# Patient Record
Sex: Male | Born: 1980 | Race: White | Hispanic: No | Marital: Married | Smoking: Current every day smoker
Health system: Southern US, Community
[De-identification: ages and names within clinical notes are randomized; demographics above are authoritative.]

## PROBLEM LIST (undated history)

## (undated) DIAGNOSIS — F909 Attention-deficit hyperactivity disorder, unspecified type: Secondary | ICD-10-CM

## (undated) DIAGNOSIS — K219 Gastro-esophageal reflux disease without esophagitis: Secondary | ICD-10-CM

## (undated) DIAGNOSIS — E785 Hyperlipidemia, unspecified: Secondary | ICD-10-CM

## (undated) DIAGNOSIS — I10 Essential (primary) hypertension: Secondary | ICD-10-CM

## (undated) DIAGNOSIS — F419 Anxiety disorder, unspecified: Secondary | ICD-10-CM

## (undated) DIAGNOSIS — G4730 Sleep apnea, unspecified: Secondary | ICD-10-CM

## (undated) DIAGNOSIS — G43909 Migraine, unspecified, not intractable, without status migrainosus: Secondary | ICD-10-CM

## (undated) DIAGNOSIS — F4310 Post-traumatic stress disorder, unspecified: Secondary | ICD-10-CM

## (undated) DIAGNOSIS — J45909 Unspecified asthma, uncomplicated: Secondary | ICD-10-CM

## (undated) DIAGNOSIS — F329 Major depressive disorder, single episode, unspecified: Secondary | ICD-10-CM

## (undated) DIAGNOSIS — G709 Myoneural disorder, unspecified: Secondary | ICD-10-CM

## (undated) DIAGNOSIS — F32A Depression, unspecified: Secondary | ICD-10-CM

## (undated) DIAGNOSIS — F319 Bipolar disorder, unspecified: Secondary | ICD-10-CM

## (undated) HISTORY — PX: SCROTAL SURGERY: SHX2387

## (undated) HISTORY — DX: Bipolar disorder, unspecified: F31.9

## (undated) HISTORY — PX: CERVICAL FUSION: SHX112

## (undated) HISTORY — DX: Essential (primary) hypertension: I10

## (undated) HISTORY — PX: WISDOM TOOTH EXTRACTION: SHX21

## (undated) HISTORY — PX: ABDOMINAL SURGERY: SHX537

## (undated) HISTORY — DX: Post-traumatic stress disorder, unspecified: F43.10

## (undated) HISTORY — DX: Hyperlipidemia, unspecified: E78.5

## (undated) HISTORY — DX: Attention-deficit hyperactivity disorder, unspecified type: F90.9

## (undated) HISTORY — PX: CARPAL TUNNEL RELEASE: SHX101

---

## 2002-02-17 ENCOUNTER — Emergency Department (HOSPITAL_COMMUNITY): Admission: EM | Admit: 2002-02-17 | Discharge: 2002-02-17 | Payer: Self-pay | Admitting: Emergency Medicine

## 2007-03-02 ENCOUNTER — Emergency Department (HOSPITAL_COMMUNITY): Admission: EM | Admit: 2007-03-02 | Discharge: 2007-03-02 | Payer: Self-pay | Admitting: Emergency Medicine

## 2007-11-25 ENCOUNTER — Emergency Department (HOSPITAL_COMMUNITY): Admission: EM | Admit: 2007-11-25 | Discharge: 2007-11-25 | Payer: Self-pay | Admitting: Emergency Medicine

## 2008-01-20 ENCOUNTER — Emergency Department (HOSPITAL_COMMUNITY): Admission: EM | Admit: 2008-01-20 | Discharge: 2008-01-21 | Payer: Self-pay | Admitting: Emergency Medicine

## 2008-02-14 ENCOUNTER — Emergency Department (HOSPITAL_COMMUNITY): Admission: EM | Admit: 2008-02-14 | Discharge: 2008-02-14 | Payer: Self-pay | Admitting: Emergency Medicine

## 2008-03-02 ENCOUNTER — Emergency Department (HOSPITAL_COMMUNITY): Admission: EM | Admit: 2008-03-02 | Discharge: 2008-03-02 | Payer: Self-pay | Admitting: Emergency Medicine

## 2008-10-18 ENCOUNTER — Emergency Department (HOSPITAL_COMMUNITY): Admission: EM | Admit: 2008-10-18 | Discharge: 2008-10-18 | Payer: Self-pay | Admitting: Emergency Medicine

## 2008-12-29 ENCOUNTER — Emergency Department (HOSPITAL_COMMUNITY): Admission: EM | Admit: 2008-12-29 | Discharge: 2008-12-29 | Payer: Self-pay | Admitting: Emergency Medicine

## 2011-09-29 ENCOUNTER — Encounter (HOSPITAL_COMMUNITY): Payer: Self-pay | Admitting: *Deleted

## 2011-09-29 ENCOUNTER — Emergency Department (HOSPITAL_COMMUNITY)
Admission: EM | Admit: 2011-09-29 | Discharge: 2011-09-29 | Disposition: A | Discharge status: Home / Self Care | Payer: Medicaid Other | Attending: Emergency Medicine | Admitting: Emergency Medicine

## 2011-09-29 DIAGNOSIS — J45909 Unspecified asthma, uncomplicated: Secondary | ICD-10-CM | POA: Insufficient documentation

## 2011-09-29 DIAGNOSIS — G43909 Migraine, unspecified, not intractable, without status migrainosus: Secondary | ICD-10-CM | POA: Insufficient documentation

## 2011-09-29 DIAGNOSIS — I10 Essential (primary) hypertension: Secondary | ICD-10-CM | POA: Insufficient documentation

## 2011-09-29 DIAGNOSIS — F17200 Nicotine dependence, unspecified, uncomplicated: Secondary | ICD-10-CM | POA: Insufficient documentation

## 2011-09-29 HISTORY — DX: Migraine, unspecified, not intractable, without status migrainosus: G43.909

## 2011-09-29 HISTORY — DX: Unspecified asthma, uncomplicated: J45.909

## 2011-09-29 HISTORY — DX: Essential (primary) hypertension: I10

## 2011-09-29 MED ORDER — KETOROLAC TROMETHAMINE 60 MG/2ML IM SOLN
60.0000 mg | Freq: Once | INTRAMUSCULAR | Status: AC
  Administered 2011-09-29: 60 mg via INTRAMUSCULAR
  Filled 2011-09-29: qty 2

## 2011-09-29 MED ORDER — ONDANSETRON HCL 4 MG PO TABS: 4.0000 mg | ORAL_TABLET | Freq: Three times a day (TID) | ORAL | Status: DC | PRN

## 2011-09-29 MED ORDER — DIPHENHYDRAMINE HCL 50 MG/ML IJ SOLN
50.0000 mg | Freq: Once | INTRAMUSCULAR | Status: AC
  Administered 2011-09-29: 50 mg via INTRAMUSCULAR
  Filled 2011-09-29: qty 1

## 2011-09-29 MED ORDER — METOCLOPRAMIDE HCL 5 MG/ML IJ SOLN
10.0000 mg | Freq: Once | INTRAMUSCULAR | Status: AC
  Administered 2011-09-29: 10 mg via INTRAMUSCULAR
  Filled 2011-09-29: qty 2

## 2011-09-29 NOTE — ED Notes (Addendum)
Headache for 3 days, vomiting now. Pale and diaphorectic

## 2011-09-29 NOTE — ED Notes (Signed)
Nausea and vomiting subsided prior to medication.  Pt states he is feeling somewhat better since vomiting.

## 2011-09-29 NOTE — ED Provider Notes (Signed)
History     CSN: [id number]  Arrival date & time 09/29/11  1807   First MD Initiated Contact with Patient 09/29/11 1858      Chief Complaint  Patient presents with  . Headache    HPI Pt was seen at 1925.  Per pt, c/o gradual onset and persistence of constant acute flair of his chronic migraine headache for the past 3 days.  Describes the headache as per his usual chronic migraine headache pain pattern for many years.  Denies headache was sudden or maximal in onset or at any time.  Denies visual changes, no focal motor weakness, no tingling/numbness in extremities, no fevers, no neck pain, no rash.      Past Medical History  Diagnosis Date  . Migraine   . Hypertension   . Asthma     Past Surgical History  Procedure Date  . Scrotal surgery   . Carpal tunnel release     History  Substance Use Topics  . Smoking status: Current Everyday Smoker  . Smokeless tobacco: Not on file  . Alcohol Use: No    Review of Systems ROS: Statement: All systems negative except as marked or noted in the HPI; Constitutional: Negative for fever and chills. ; ; Eyes: Negative for eye pain, redness and discharge. ; ; ENMT: Negative for ear pain, hoarseness, nasal congestion, sinus pressure and sore throat. ; ; Cardiovascular: Negative for chest pain, palpitations, diaphoresis, dyspnea and peripheral edema. ; ; Respiratory: Negative for cough, wheezing and stridor. ; ; Gastrointestinal: +N/V.  Negative for diarrhea, abdominal pain, blood in stool, hematemesis, jaundice and rectal bleeding. . ; ; Genitourinary: Negative for dysuria, flank pain and hematuria. ; ; Musculoskeletal: Negative for back pain and neck pain. Negative for swelling and trauma.; ; Skin: Negative for pruritus, rash, abrasions, blisters, bruising and skin lesion.; ; Neuro: +headache.  Negative for lightheadedness and neck stiffness. Negative for weakness, altered level of consciousness , altered mental status, extremity weakness,  paresthesias, involuntary movement, seizure and syncope.     Allergies  Bee venom and Prednisone  Home Medications   Current Outpatient Rx  Name Route Sig Dispense Refill  . ALBUTEROL SULFATE HFA 108 (90 BASE) MCG/ACT IN AERS Inhalation Inhale 2 puffs into the lungs every 6 (six) hours as needed.    Marland Kitchen FLUOXETINE HCL 20 MG PO CAPS Oral Take 20 mg by mouth at bedtime.    Marland Kitchen PROPRANOLOL HCL 80 MG PO TABS Oral Take 80 mg by mouth every morning.    Marland Kitchen TRAMADOL HCL 50 MG PO TABS Oral Take 50 mg by mouth every 6 (six) hours as needed.      BP 162/93  Pulse 71  Temp 97.5 F (36.4 C) (Oral)  Resp 18  Ht 6' (1.829 m)  Wt 230 lb (104.327 kg)  BMI 31.19 kg/m2  SpO2 99%  Physical Exam 1730: Physical examination:  Nursing notes reviewed; Vital signs and O2 SAT reviewed;  Constitutional: Well developed, Well nourished, Well hydrated, In no acute distress; Head:  Normocephalic, atraumatic; Eyes: EOMI, PERRL, No scleral icterus; ENMT: Mouth and pharynx normal, Mucous membranes moist; Neck: Supple, Full range of motion, No lymphadenopathy; Cardiovascular: Regular rate and rhythm, No murmur, rub, or gallop; Respiratory: Breath sounds clear & equal bilaterally, No rales, rhonchi, wheezes.  Speaking full sentences with ease, Normal respiratory effort/excursion; Chest: Nontender, Movement normal; Abdomen: Soft, Nontender, Nondistended, Normal bowel sounds;; Extremities: Pulses normal, No tenderness, No edema, No calf edema or asymmetry.; Neuro: AA&Ox3, Major  CN grossly intact.  Speech clear. Climbs on and off stretcher by himself easily.  Gait steady. No gross focal motor or sensory deficits in extremities.; Skin: Color normal, Warm, Dry.   ED Course  Procedures   MDM  MDM Reviewed: vitals, nursing note and previous chart      8:47 PM:  Improved and wants to go home now.  Long hx of chronic migraine headaches, apparently usually goes to [facility] ED.  Pt endorses acute flair of his usual long  standing chronic pain today, no change from his usual chronic pain pattern.  Pt encouraged to f/u with his PMD and Pain Management doctor for good continuity of care and control of his chronic pain.  Verb understanding.         Laray Anger, DO 10/01/11 2228

## 2011-09-29 NOTE — ED Notes (Addendum)
Pt with active vomiting.  MD aware.

## 2011-10-03 ENCOUNTER — Encounter (HOSPITAL_COMMUNITY): Payer: Self-pay | Admitting: *Deleted

## 2011-10-03 ENCOUNTER — Emergency Department (HOSPITAL_COMMUNITY)
Admission: EM | Admit: 2011-10-03 | Discharge: 2011-10-03 | Disposition: A | Discharge status: Home / Self Care | Payer: Medicaid Other | Attending: Emergency Medicine | Admitting: Emergency Medicine

## 2011-10-03 DIAGNOSIS — T6391XA Toxic effect of contact with unspecified venomous animal, accidental (unintentional), initial encounter: Secondary | ICD-10-CM | POA: Insufficient documentation

## 2011-10-03 DIAGNOSIS — F17200 Nicotine dependence, unspecified, uncomplicated: Secondary | ICD-10-CM | POA: Insufficient documentation

## 2011-10-03 DIAGNOSIS — J45909 Unspecified asthma, uncomplicated: Secondary | ICD-10-CM | POA: Insufficient documentation

## 2011-10-03 DIAGNOSIS — I10 Essential (primary) hypertension: Secondary | ICD-10-CM | POA: Insufficient documentation

## 2011-10-03 DIAGNOSIS — T63481A Toxic effect of venom of other arthropod, accidental (unintentional), initial encounter: Secondary | ICD-10-CM | POA: Insufficient documentation

## 2011-10-03 MED ORDER — PREDNISONE 20 MG PO TABS
60.0000 mg | ORAL_TABLET | Freq: Once | ORAL | Status: DC
  Filled 2011-10-03: qty 3

## 2011-10-03 MED ORDER — FAMOTIDINE 20 MG PO TABS
20.0000 mg | ORAL_TABLET | Freq: Once | ORAL | Status: AC
  Administered 2011-10-03: 20 mg via ORAL
  Filled 2011-10-03: qty 1

## 2011-10-03 MED ORDER — DIPHENHYDRAMINE HCL 25 MG PO CAPS
50.0000 mg | ORAL_CAPSULE | Freq: Once | ORAL | Status: AC
  Administered 2011-10-03: 50 mg via ORAL
  Filled 2011-10-03: qty 2

## 2011-10-03 NOTE — ED Notes (Signed)
Bee sting to lt forearm ? Wasp-Swelling to forearm and hand,  Since then has had chest pain, with some sob, no rash.

## 2011-10-03 NOTE — ED Notes (Signed)
Patient comes to ED c/o of bee sting to the left arm at about 1100.  Patient reports a pain of 8/10.  Patient's arm is noticeably more swollen than the right arm.  Patient states having problem closing his hand entirely with it causing him the most amount of pain.

## 2011-10-03 NOTE — ED Provider Notes (Signed)
History   This chart was scribed for Gary Melter, MD by Shari Heritage. The patient was seen in room APA19/APA19. Patient's care was started at 1423.     CSN: [id number]  Arrival date & time 10/03/11  1423   First MD Initiated Contact with Patient 10/03/11 1508      Chief Complaint  Patient presents with  . Insect Bite    (Consider location/radiation/quality/duration/timing/severity/associated sxs/prior treatment) Patient is a 31 y.o. male presenting with allergic reaction. The history is provided by the patient. No language interpreter was used.  Allergic Reaction The primary symptoms are  shortness of breath and nausea. The primary symptoms do not include cough, vomiting or dizziness. The current episode started 3 to 5 hours ago. This is a new problem.  The shortness of breath began today. The shortness of breath developed suddenly. The shortness of breath is moderate. The patient's medical history is significant for asthma. The patient's medical history does not include CHF, COPD or chronic lung disease.  Nausea began today.  The onset of the reaction was associated with insect bite/sting. Significant symptoms also include itching. Significant symptoms that are not present include eye redness, flushing or rhinorrhea.    Gary Higgins is a 31 y.o. male who presents to the Emergency Department complaining of a bee sting to left forearm onset 4 hours ago with associated itching and swelling to the left forearm that began immediately after the sting. Other associated symptoms include SOB, chest tightness and nausea. Patient denies actual chest pain, cough, back pain, weakness, dizziness, vomiting. Patient states that he has a childhood history of allergic reactions to bee stings. Patient states that he did not take any medication after the sting. Patient's daily medications include Inderal for treatment of HTN and Prozac. Patient with medical h/o HTN, asthma and migraine. Surgical h/o  scrotal surgery and carpal tunnel release. Patient is a current everyday smoker.  Past Medical History  Diagnosis Date  . Migraine   . Hypertension   . Asthma     Past Surgical History  Procedure Date  . Scrotal surgery   . Carpal tunnel release     History reviewed. No pertinent family history.  History  Substance Use Topics  . Smoking status: Current Everyday Smoker  . Smokeless tobacco: Not on file  . Alcohol Use: No      Review of Systems  HENT: Negative for rhinorrhea.   Eyes: Negative for redness.  Respiratory: Positive for chest tightness and shortness of breath. Negative for cough.   Cardiovascular: Negative for chest pain.  Gastrointestinal: Positive for nausea. Negative for vomiting.  Musculoskeletal: Negative for back pain.  Skin: Positive for itching. Negative for flushing.  Neurological: Negative for dizziness and weakness.  All other systems reviewed and are negative.    Allergies  Bee venom and Prednisone  Home Medications   Current Outpatient Rx  Name Route Sig Dispense Refill  . ALBUTEROL SULFATE HFA 108 (90 BASE) MCG/ACT IN AERS Inhalation Inhale 2 puffs into the lungs every 6 (six) hours as needed.    Marland Kitchen FLUOXETINE HCL 20 MG PO CAPS Oral Take 20 mg by mouth at bedtime.    Marland Kitchen PROPRANOLOL HCL 80 MG PO TABS Oral Take 80 mg by mouth every morning.    Marland Kitchen TRAMADOL HCL 50 MG PO TABS Oral Take 50 mg by mouth every 6 (six) hours as needed.      BP 145/95  Pulse 89  Temp 97.7 F (36.5 C) (Oral)  Resp 21  Ht 6' (1.829 m)  Wt 235 lb (106.595 kg)  BMI 31.87 kg/m2  SpO2 99%  Physical Exam  Nursing note and vitals reviewed. Constitutional: He is oriented to person, place, and time. He appears well-developed and well-nourished. No distress.  HENT:  Head: Normocephalic and atraumatic.  Eyes: Conjunctivae and EOM are normal. Pupils are equal, round, and reactive to light.  Neck: Trachea normal. Neck supple. No tracheal deviation present.    Cardiovascular: Normal rate and regular rhythm.   No murmur heard. Pulmonary/Chest: Effort normal and breath sounds normal. No respiratory distress. He has no wheezes. He has no rhonchi.  Abdominal: Soft. He exhibits no distension and no mass. There is tenderness (mild).  Musculoskeletal: Normal range of motion.  Lymphadenopathy:       Right: No inguinal adenopathy present.       Left: No inguinal adenopathy present.  Neurological: He is alert and oriented to person, place, and time. No sensory deficit.  Skin: Skin is dry.  Psychiatric: He has a normal mood and affect. His behavior is normal.    ED Course  Procedures (including critical care time) DIAGNOSTIC STUDIES: Oxygen Saturation is 99% on room air, normal by my interpretation.    COORDINATION OF CARE: 3:13pm- Patient informed of current plan for treatment and evaluation and agrees with plan at this time. Will administer Benadryl, Pepcid and Prednisone to treat symptoms of allergic reaction.   5:07PM- Patient states that itching has subsided, but there is still swelling to his left forearm. Patient refused Prednisone treatment due to claims that he is allergic. Will discharge patient without home prescriptions.  Labs Reviewed - No data to display  No results found.   1. Insect sting allergy, current reaction       MDM  Left arm insect envenomation, likely, hymenoptera. Reaction is extrinsic. Patient refused prednisone in the emergency department because of a stated allergy. Patient is stable for discharge with symptomatic treatment.   Plan: Home Medications- Pepcid and Benadryl; Home Treatments- rest; Recommended follow up- return prn    I personally performed the services described in this documentation, which was scribed in my presence. The recorded information has been reviewed and considered.     Gary Melter, MD 10/03/11 [phone]

## 2012-03-17 DIAGNOSIS — G4730 Sleep apnea, unspecified: Secondary | ICD-10-CM

## 2012-03-17 HISTORY — DX: Sleep apnea, unspecified: G47.30

## 2012-09-13 ENCOUNTER — Encounter (HOSPITAL_COMMUNITY): Payer: Self-pay | Admitting: Emergency Medicine

## 2012-09-13 DIAGNOSIS — Y9301 Activity, walking, marching and hiking: Secondary | ICD-10-CM | POA: Insufficient documentation

## 2012-09-13 DIAGNOSIS — G43909 Migraine, unspecified, not intractable, without status migrainosus: Secondary | ICD-10-CM | POA: Insufficient documentation

## 2012-09-13 DIAGNOSIS — J45909 Unspecified asthma, uncomplicated: Secondary | ICD-10-CM | POA: Insufficient documentation

## 2012-09-13 DIAGNOSIS — Z79899 Other long term (current) drug therapy: Secondary | ICD-10-CM | POA: Insufficient documentation

## 2012-09-13 DIAGNOSIS — I10 Essential (primary) hypertension: Secondary | ICD-10-CM | POA: Insufficient documentation

## 2012-09-13 DIAGNOSIS — Y929 Unspecified place or not applicable: Secondary | ICD-10-CM | POA: Insufficient documentation

## 2012-09-13 DIAGNOSIS — F17200 Nicotine dependence, unspecified, uncomplicated: Secondary | ICD-10-CM | POA: Insufficient documentation

## 2012-09-13 DIAGNOSIS — S93409A Sprain of unspecified ligament of unspecified ankle, initial encounter: Secondary | ICD-10-CM | POA: Insufficient documentation

## 2012-09-13 DIAGNOSIS — X500XXA Overexertion from strenuous movement or load, initial encounter: Secondary | ICD-10-CM | POA: Insufficient documentation

## 2012-09-13 NOTE — ED Notes (Signed)
Patient states he injured his left foot last night while walking down stairs.

## 2012-09-14 ENCOUNTER — Emergency Department (HOSPITAL_COMMUNITY)
Admit: 2012-09-14 | Discharge: 2012-09-14 | Disposition: A | Discharge status: Home / Self Care | Payer: Medicaid Other | Attending: Emergency Medicine | Admitting: Emergency Medicine

## 2012-09-14 ENCOUNTER — Emergency Department (HOSPITAL_COMMUNITY): Payer: Medicaid Other

## 2012-09-14 ENCOUNTER — Emergency Department (HOSPITAL_COMMUNITY)
Admission: EM | Admit: 2012-09-14 | Discharge: 2012-09-14 | Disposition: A | Discharge status: Home / Self Care | Payer: Medicaid Other | Attending: Emergency Medicine | Admitting: Emergency Medicine

## 2012-09-14 DIAGNOSIS — S93402A Sprain of unspecified ligament of left ankle, initial encounter: Secondary | ICD-10-CM

## 2012-09-14 MED ORDER — HYDROCODONE-ACETAMINOPHEN 5-325 MG PO TABS
1.0000 | ORAL_TABLET | Freq: Once | ORAL | Status: AC
  Administered 2012-09-14: 1 via ORAL
  Filled 2012-09-14: qty 1

## 2012-09-14 MED ORDER — IBUPROFEN 600 MG PO TABS: 600.0000 mg | ORAL_TABLET | Freq: Four times a day (QID) | ORAL | Status: DC | PRN

## 2012-09-14 MED ORDER — HYDROCODONE-ACETAMINOPHEN 5-325 MG PO TABS: 1.0000 | ORAL_TABLET | ORAL | Status: DC | PRN

## 2012-09-14 NOTE — ED Provider Notes (Signed)
History    CSN: [id number] Arrival date & time 09/13/12  2337  First MD Initiated Contact with Patient 09/14/12 0107     Chief Complaint  Patient presents with  . Foot Injury   (Consider location/radiation/quality/duration/timing/severity/associated sxs/prior Treatment) HPI Comments: Gary Higgins is a 32 y.o. Male presenting with left ankle and foot pain since inverting the foot while walking down a flight of steps yesterday evening.  He describes constant aching pain without radiation which is worsened with weightbearing and with palpation.  He has applied ice packs and has been using elevation without relief of symptoms.  He has also tried tramadol which he has first chronic low back pain which is also not relieved his symptoms.  He denies numbness distal to the injury site.  He denies any other injuries.     The history is provided by the patient.   Past Medical History  Diagnosis Date  . Migraine   . Hypertension   . Asthma    Past Surgical History  Procedure Laterality Date  . Scrotal surgery    . Carpal tunnel release     No family history on file. History  Substance Use Topics  . Smoking status: Current Every Day Smoker  . Smokeless tobacco: Not on file  . Alcohol Use: No    Review of Systems  Musculoskeletal: Positive for joint swelling and arthralgias.  Skin: Negative for wound.  Neurological: Negative for weakness and numbness.    Allergies  Bee venom and Prednisone  Home Medications   Current Outpatient Rx  Name  Route  Sig  Dispense  Refill  . ALPRAZolam (XANAX) 0.5 MG tablet   Oral   Take 0.5 mg by mouth 2 (two) times daily.         . divalproex (DEPAKOTE) 500 MG DR tablet   Oral   Take 500 mg by mouth 2 (two) times daily.         . propranolol (INDERAL) 80 MG tablet   Oral   Take 80 mg by mouth every morning.         . traMADol (ULTRAM) 50 MG tablet   Oral   Take 50 mg by mouth every 6 (six) hours as needed.         Marland Kitchen  albuterol (PROVENTIL HFA;VENTOLIN HFA) 108 (90 BASE) MCG/ACT inhaler   Inhalation   Inhale 2 puffs into the lungs every 6 (six) hours as needed.         Marland Kitchen FLUoxetine (PROZAC) 20 MG capsule   Oral   Take 20 mg by mouth at bedtime.         Marland Kitchen HYDROcodone-acetaminophen (NORCO/VICODIN) 5-325 MG per tablet   Oral   Take 1 tablet by mouth every 4 (four) hours as needed for pain.   10 tablet   0   . ibuprofen (ADVIL,MOTRIN) 600 MG tablet   Oral   Take 1 tablet (600 mg total) by mouth every 6 (six) hours as needed for pain.   30 tablet   0   . omeprazole (PRILOSEC) 40 MG capsule   Oral   Take 40 mg by mouth daily.          BP 128/79  Pulse 94  Temp(Src) 97.6 F (36.4 C) (Oral)  Resp 18  Ht 6' (1.829 m)  Wt 235 lb (106.595 kg)  BMI 31.86 kg/m2  SpO2 94% Physical Exam  Nursing note and vitals reviewed. Constitutional: He appears well-developed and well-nourished.  HENT:  Head: Normocephalic.  Cardiovascular: Normal rate and intact distal pulses.  Exam reveals no decreased pulses.   Pulses:      Dorsalis pedis pulses are 2+ on the right side, and 2+ on the left side.       Posterior tibial pulses are 2+ on the right side, and 2+ on the left side.  Musculoskeletal: He exhibits edema and tenderness.       Left ankle: He exhibits decreased range of motion and swelling. He exhibits no ecchymosis, no deformity, no laceration and normal pulse. Tenderness. Lateral malleolus tenderness found. No head of 5th metatarsal and no proximal fibula tenderness found. Achilles tendon normal.  Tenderness to palpation also along proximal dorsal foot with mild soft tissue swelling.  Dorsalis pedis pulses is full.  Distal cap refill is less than 3 seconds.  Neurological: He is alert. No sensory deficit.  Skin: Skin is warm, dry and intact.    ED Course  Procedures (including critical care time) Labs Reviewed - No data to display Dg Ankle Complete Left  09/14/2012   *RADIOLOGY REPORT*   Clinical Data: Left foot and ankle pain.  LEFT ANKLE COMPLETE - 3+ VIEW  Comparison: None.  Findings: Ankle mortise congruent.  Talar dome intact.  There is no fracture.  Soft tissues appear within normal limits.  Achilles enthesopathy is noted.  IMPRESSION: No acute osseous abnormality.   Original Report Authenticated By: Andreas Newport, M.D.   Dg Foot Complete Left  09/14/2012   *RADIOLOGY REPORT*  Clinical Data: Pain and swelling after injury.  LEFT FOOT - COMPLETE 3+ VIEW  Comparison: None.  Findings: There is mild appearing soft tissue swelling over the dorsum of the foot.  No underlying bony or joint abnormality is identified.  IMPRESSION: Mild soft tissue swelling without fracture.   Original Report Authenticated By: Holley Dexter, M.D.   1. Ankle sprain, left, initial encounter     MDM  X-rays were reviewed prior to discharge home and discuss the patient.  He was placed in an ASO, crutches given.  Encouraged rest ice compression and elevation.  Prescribed ibuprofen and hydrocodone, advised to hold tramadol using hydrocodone.  Followup with PCP if not improving over the next 10 days.  Burgess Amor, PA-C 09/14/12 [phone]

## 2012-09-14 NOTE — ED Provider Notes (Signed)
Medical screening examination/treatment/procedure(s) were performed by non-physician practitioner and as supervising physician I was immediately available for consultation/collaboration.  Marquelle Musgrave S. Winifred Balogh, MD 09/14/12 0437 

## 2014-01-13 ENCOUNTER — Encounter (HOSPITAL_COMMUNITY): Payer: Self-pay | Admitting: Emergency Medicine

## 2014-01-13 ENCOUNTER — Emergency Department (HOSPITAL_COMMUNITY)
Admission: EM | Admit: 2014-01-13 | Discharge: 2014-01-13 | Disposition: A | Discharge status: Home / Self Care | Payer: Medicaid Other | Attending: Emergency Medicine | Admitting: Emergency Medicine

## 2014-01-13 ENCOUNTER — Emergency Department (HOSPITAL_COMMUNITY): Payer: Medicaid Other

## 2014-01-13 DIAGNOSIS — Y92410 Unspecified street and highway as the place of occurrence of the external cause: Secondary | ICD-10-CM | POA: Diagnosis not present

## 2014-01-13 DIAGNOSIS — I10 Essential (primary) hypertension: Secondary | ICD-10-CM | POA: Insufficient documentation

## 2014-01-13 DIAGNOSIS — S40812A Abrasion of left upper arm, initial encounter: Secondary | ICD-10-CM | POA: Insufficient documentation

## 2014-01-13 DIAGNOSIS — M25512 Pain in left shoulder: Secondary | ICD-10-CM

## 2014-01-13 DIAGNOSIS — S4992XA Unspecified injury of left shoulder and upper arm, initial encounter: Secondary | ICD-10-CM | POA: Diagnosis present

## 2014-01-13 DIAGNOSIS — Z720 Tobacco use: Secondary | ICD-10-CM | POA: Diagnosis not present

## 2014-01-13 DIAGNOSIS — Z79899 Other long term (current) drug therapy: Secondary | ICD-10-CM | POA: Diagnosis not present

## 2014-01-13 DIAGNOSIS — Y9389 Activity, other specified: Secondary | ICD-10-CM | POA: Diagnosis not present

## 2014-01-13 DIAGNOSIS — M542 Cervicalgia: Secondary | ICD-10-CM

## 2014-01-13 DIAGNOSIS — S3992XA Unspecified injury of lower back, initial encounter: Secondary | ICD-10-CM | POA: Insufficient documentation

## 2014-01-13 DIAGNOSIS — S0990XA Unspecified injury of head, initial encounter: Secondary | ICD-10-CM | POA: Diagnosis not present

## 2014-01-13 DIAGNOSIS — G43909 Migraine, unspecified, not intractable, without status migrainosus: Secondary | ICD-10-CM | POA: Diagnosis not present

## 2014-01-13 DIAGNOSIS — S199XXA Unspecified injury of neck, initial encounter: Secondary | ICD-10-CM | POA: Diagnosis not present

## 2014-01-13 DIAGNOSIS — J45909 Unspecified asthma, uncomplicated: Secondary | ICD-10-CM | POA: Insufficient documentation

## 2014-01-13 MED ORDER — IBUPROFEN 800 MG PO TABS: 800.0000 mg | ORAL_TABLET | Freq: Three times a day (TID) | ORAL | Status: DC

## 2014-01-13 MED ORDER — TETANUS-DIPHTH-ACELL PERTUSSIS 5-2.5-18.5 LF-MCG/0.5 IM SUSP
0.5000 mL | Freq: Once | INTRAMUSCULAR | Status: AC
  Administered 2014-01-13: 0.5 mL via INTRAMUSCULAR
  Filled 2014-01-13: qty 0.5

## 2014-01-13 MED ORDER — BACITRACIN ZINC 500 UNIT/GM EX OINT
TOPICAL_OINTMENT | CUTANEOUS | Status: AC
  Administered 2014-01-13: 1
  Filled 2014-01-13: qty 2.7

## 2014-01-13 MED ORDER — CYCLOBENZAPRINE HCL 10 MG PO TABS: 10.0000 mg | ORAL_TABLET | Freq: Two times a day (BID) | ORAL | Status: DC | PRN
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
## 2014-01-13 NOTE — ED Notes (Signed)
Pt removed from back board by EDP

## 2014-05-04 ENCOUNTER — Other Ambulatory Visit (HOSPITAL_COMMUNITY): Payer: Self-pay | Admitting: Neurosurgery

## 2014-05-10 ENCOUNTER — Other Ambulatory Visit (HOSPITAL_COMMUNITY): Payer: Self-pay | Admitting: *Deleted

## 2014-05-10 NOTE — Progress Notes (Signed)
Pre-op orders not signed. Called Dr. Sueanne Margaritaabbell's office and left message on Susan's (his scheduler) voicemail requesting orders be signed.

## 2014-05-11 ENCOUNTER — Encounter (HOSPITAL_COMMUNITY)
Admission: RE | Admit: 2014-05-11 | Discharge: 2014-05-11 | Disposition: A | Discharge status: Home / Self Care | Payer: Medicaid Other | Source: Ambulatory Visit | Attending: Neurosurgery | Admitting: Neurosurgery

## 2014-05-11 ENCOUNTER — Encounter (HOSPITAL_COMMUNITY): Payer: Self-pay

## 2014-05-11 DIAGNOSIS — I10 Essential (primary) hypertension: Secondary | ICD-10-CM | POA: Diagnosis not present

## 2014-05-11 DIAGNOSIS — F419 Anxiety disorder, unspecified: Secondary | ICD-10-CM | POA: Diagnosis not present

## 2014-05-11 DIAGNOSIS — Z91030 Bee allergy status: Secondary | ICD-10-CM | POA: Diagnosis not present

## 2014-05-11 DIAGNOSIS — Z91040 Latex allergy status: Secondary | ICD-10-CM | POA: Diagnosis not present

## 2014-05-11 DIAGNOSIS — M5022 Other cervical disc displacement, mid-cervical region: Secondary | ICD-10-CM | POA: Diagnosis not present

## 2014-05-11 DIAGNOSIS — F329 Major depressive disorder, single episode, unspecified: Secondary | ICD-10-CM | POA: Diagnosis not present

## 2014-05-11 DIAGNOSIS — G4730 Sleep apnea, unspecified: Secondary | ICD-10-CM | POA: Diagnosis not present

## 2014-05-11 DIAGNOSIS — G709 Myoneural disorder, unspecified: Secondary | ICD-10-CM | POA: Diagnosis not present

## 2014-05-11 DIAGNOSIS — J45909 Unspecified asthma, uncomplicated: Secondary | ICD-10-CM | POA: Diagnosis not present

## 2014-05-11 DIAGNOSIS — K219 Gastro-esophageal reflux disease without esophagitis: Secondary | ICD-10-CM | POA: Diagnosis not present

## 2014-05-11 DIAGNOSIS — F17210 Nicotine dependence, cigarettes, uncomplicated: Secondary | ICD-10-CM | POA: Diagnosis not present

## 2014-05-11 DIAGNOSIS — Z888 Allergy status to other drugs, medicaments and biological substances status: Secondary | ICD-10-CM | POA: Diagnosis not present

## 2014-05-11 DIAGNOSIS — Z79899 Other long term (current) drug therapy: Secondary | ICD-10-CM | POA: Diagnosis not present

## 2014-05-11 HISTORY — DX: Myoneural disorder, unspecified: G70.9

## 2014-05-11 HISTORY — DX: Major depressive disorder, single episode, unspecified: F32.9

## 2014-05-11 HISTORY — DX: Anxiety disorder, unspecified: F41.9

## 2014-05-11 HISTORY — DX: Sleep apnea, unspecified: G47.30

## 2014-05-11 HISTORY — DX: Depression, unspecified: F32.A

## 2014-05-11 HISTORY — DX: Gastro-esophageal reflux disease without esophagitis: K21.9

## 2014-05-11 LAB — CBC
HCT: 43.2 % (ref 39.0–52.0)
Hemoglobin: 15 g/dL (ref 13.0–17.0)
MCH: 29.1 pg (ref 26.0–34.0)
MCHC: 34.7 g/dL (ref 30.0–36.0)
MCV: 83.9 fL (ref 78.0–100.0)
Platelets: 219 10*3/uL (ref 150–400)
RBC: 5.15 MIL/uL (ref 4.22–5.81)
RDW: 12.3 % (ref 11.5–15.5)
WBC: 10.6 10*3/uL — ABNORMAL HIGH (ref 4.0–10.5)

## 2014-05-11 LAB — BASIC METABOLIC PANEL
Anion gap: 11 (ref 5–15)
BUN: 6 mg/dL (ref 6–23)
CO2: 25 mmol/L (ref 19–32)
Calcium: 9.7 mg/dL (ref 8.4–10.5)
Chloride: 104 mmol/L (ref 96–112)
Creatinine, Ser: 0.93 mg/dL (ref 0.50–1.35)
GFR calc Af Amer: >90 mL/min (ref >90)
GFR calc non Af Amer: >90 mL/min (ref >90)
Glucose, Bld: 106 mg/dL — ABNORMAL HIGH (ref 70–99)
Potassium: 4 mmol/L (ref 3.5–5.1)
Sodium: 140 mmol/L (ref 135–145)

## 2014-05-11 LAB — SURGICAL PCR SCREEN
MRSA, PCR: NEGATIVE
Staphylococcus aureus: POSITIVE — AB

## 2014-05-11 NOTE — Pre-Procedure Instructions (Signed)
Gary LovelyRobert D Mote  05/11/2014   Your procedure is scheduled on:  05/17/2014  [facility] at 6:30 AM.  Call this number if you have problems the morning of surgery: [phone]   Remember:   Do not eat food or drink liquids after midnight.  On Tuesday    Take these medicines the morning of surgery with A SIP OF WATER: Xanax, Robaxin, Omeprazole, Effexor, Inderal    Do not wear jewelry   Do not wear lotions, powders, or perfumes. You may wear deodorant.              Men may shave face and neck.   Do not bring valuables to the hospital.  [facility] is not responsible                  for any belongings or valuables.               Contacts, dentures or bridgework may not be worn into surgery.   Leave suitcase in the car. After surgery it may be brought to your room.   For patients admitted to the hospital, discharge time is determined by your                treatment team.               Patients discharged the day of surgery will not be allowed to drive  home.  Name and phone number of your driver: with wife  Special Instructions: Special Instructions: [place] - Preparing for Surgery  Before surgery, you can play an important role.  Because skin is not sterile, your skin needs to be as free of germs as possible.  You can reduce the number of germs on you skin by washing with CHG (chlorahexidine gluconate) soap before surgery.  CHG is an antiseptic cleaner which kills germs and bonds with the skin to continue killing germs even after washing.  Please DO NOT use if you have an allergy to CHG or antibacterial soaps.  If your skin becomes reddened/irritated stop using the CHG and inform your nurse when you arrive at Short Stay.  Do not shave (including legs and underarms) for at least 48 hours prior to the first CHG shower.  You may shave your face.  Please follow these instructions carefully:   1.  Shower with CHG Soap the night before surgery and  the  morning of Surgery.  2.  If you choose to wash your hair, wash your hair first as usual with your  normal shampoo.  3.  After you shampoo, rinse your hair and body thoroughly to remove the  Shampoo.  4.  Use CHG as you would any other liquid soap.  You can apply chg directly to the skin and wash gently with scrungie or a clean washcloth.  5.  Apply the CHG Soap to your body ONLY FROM THE NECK DOWN.    Do not use on open wounds or open sores.  Avoid contact with your eyes, ears, mouth and genitals (private parts).  Wash genitals (private parts)   with your normal soap.  6.  Wash thoroughly, paying special attention to the area where your surgery will be performed.  7.  Thoroughly rinse your body with warm water from the neck down.  8.  DO NOT shower/wash with your normal soap after using and rinsing off   the CHG Soap.  9.  Pat yourself dry with a  clean towel.            10.  Wear clean pajamas.            11.  Place clean sheets on your bed the night of your first shower and do not sleep with pets.  Day of Surgery  Do not apply any lotions/deodorants the morning of surgery.  Please wear clean clothes to the hospital/surgery center.   Please read over the following fact sheets that you were given: Pain Booklet, Coughing and Deep Breathing, MRSA Information and Surgical Site Infection Prevention

## 2014-05-11 NOTE — Progress Notes (Addendum)
I called a prescription for Mupirocin ointment to [facility].

## 2014-05-11 NOTE — Progress Notes (Signed)
Call to pharm. Tech to do medication reconciliation

## 2014-05-11 NOTE — Progress Notes (Signed)
Call to Dr. Franky Machoabbell office, asked that Dr. Franky Machoabbell sign his epic orders.  Spoke with Lupita Leashonna.

## 2014-05-11 NOTE — Progress Notes (Signed)
RE[facility]ing [facility] in the distant past, also reports that he had a sleep study there as well.

## 2014-05-16 MED ORDER — CEFAZOLIN SODIUM-DEXTROSE 2-3 GM-% IV SOLR
2.0000 g | INTRAVENOUS | Status: AC
  Administered 2014-05-17: 2 g via INTRAVENOUS
  Filled 2014-05-16: qty 50

## 2014-05-17 ENCOUNTER — Ambulatory Visit (HOSPITAL_COMMUNITY): Payer: Medicaid Other | Admitting: Emergency Medicine

## 2014-05-17 ENCOUNTER — Ambulatory Visit (HOSPITAL_COMMUNITY): Payer: Medicaid Other | Admitting: Certified Registered Nurse Anesthetist

## 2014-05-17 ENCOUNTER — Encounter (HOSPITAL_COMMUNITY)
Admission: RE | Disposition: A | Discharge status: Home / Self Care | Payer: Self-pay | Source: Ambulatory Visit | Attending: Neurosurgery

## 2014-05-17 ENCOUNTER — Ambulatory Visit (HOSPITAL_COMMUNITY)
Admission: RE | Admit: 2014-05-17 | Discharge: 2014-05-18 | Disposition: A | Discharge status: Home / Self Care | Payer: Medicaid Other | Source: Ambulatory Visit | Attending: Neurosurgery | Admitting: Neurosurgery

## 2014-05-17 ENCOUNTER — Encounter (HOSPITAL_COMMUNITY): Payer: Self-pay | Admitting: Certified Registered Nurse Anesthetist

## 2014-05-17 ENCOUNTER — Ambulatory Visit (HOSPITAL_COMMUNITY): Payer: Medicaid Other

## 2014-05-17 DIAGNOSIS — F17210 Nicotine dependence, cigarettes, uncomplicated: Secondary | ICD-10-CM | POA: Insufficient documentation

## 2014-05-17 DIAGNOSIS — G709 Myoneural disorder, unspecified: Secondary | ICD-10-CM | POA: Insufficient documentation

## 2014-05-17 DIAGNOSIS — I10 Essential (primary) hypertension: Secondary | ICD-10-CM | POA: Diagnosis not present

## 2014-05-17 DIAGNOSIS — F329 Major depressive disorder, single episode, unspecified: Secondary | ICD-10-CM | POA: Insufficient documentation

## 2014-05-17 DIAGNOSIS — Z91040 Latex allergy status: Secondary | ICD-10-CM | POA: Insufficient documentation

## 2014-05-17 DIAGNOSIS — F419 Anxiety disorder, unspecified: Secondary | ICD-10-CM | POA: Insufficient documentation

## 2014-05-17 DIAGNOSIS — G4730 Sleep apnea, unspecified: Secondary | ICD-10-CM | POA: Diagnosis not present

## 2014-05-17 DIAGNOSIS — Z419 Encounter for procedure for purposes other than remedying health state, unspecified: Secondary | ICD-10-CM

## 2014-05-17 DIAGNOSIS — Z888 Allergy status to other drugs, medicaments and biological substances status: Secondary | ICD-10-CM | POA: Insufficient documentation

## 2014-05-17 DIAGNOSIS — Z91030 Bee allergy status: Secondary | ICD-10-CM | POA: Insufficient documentation

## 2014-05-17 DIAGNOSIS — M5022 Other cervical disc displacement, mid-cervical region: Secondary | ICD-10-CM | POA: Insufficient documentation

## 2014-05-17 DIAGNOSIS — J45909 Unspecified asthma, uncomplicated: Secondary | ICD-10-CM | POA: Insufficient documentation

## 2014-05-17 DIAGNOSIS — K219 Gastro-esophageal reflux disease without esophagitis: Secondary | ICD-10-CM | POA: Insufficient documentation

## 2014-05-17 DIAGNOSIS — Z79899 Other long term (current) drug therapy: Secondary | ICD-10-CM | POA: Insufficient documentation

## 2014-05-17 DIAGNOSIS — M5020 Other cervical disc displacement, unspecified cervical region: Secondary | ICD-10-CM | POA: Diagnosis present

## 2014-05-17 HISTORY — PX: ANTERIOR CERVICAL DECOMP/DISCECTOMY FUSION: SHX1161

## 2014-05-17 SURGERY — ANTERIOR CERVICAL DECOMPRESSION/DISCECTOMY FUSION 1 LEVEL
Anesthesia: General | Site: Neck

## 2014-05-17 MED ORDER — MORPHINE SULFATE 2 MG/ML IJ SOLN
1.0000 mg | INTRAMUSCULAR | Status: DC | PRN
  Administered 2014-05-18: 2 mg via INTRAVENOUS
  Filled 2014-05-17: qty 1

## 2014-05-17 MED ORDER — ACETAMINOPHEN 325 MG PO TABS: 650.0000 mg | ORAL_TABLET | ORAL | Status: DC | PRN

## 2014-05-17 MED ORDER — HYDROMORPHONE HCL 1 MG/ML IJ SOLN: 0.2500 mg | INTRAMUSCULAR | Status: DC | PRN

## 2014-05-17 MED ORDER — SODIUM CHLORIDE 0.9 % IJ SOLN
INTRAMUSCULAR | Status: AC
  Filled 2014-05-17: qty 10

## 2014-05-17 MED ORDER — LIDOCAINE HCL (CARDIAC) 20 MG/ML IV SOLN
INTRAVENOUS | Status: AC
  Filled 2014-05-17: qty 5

## 2014-05-17 MED ORDER — MIDAZOLAM HCL 2 MG/2ML IJ SOLN
INTRAMUSCULAR | Status: AC
  Filled 2014-05-17: qty 2

## 2014-05-17 MED ORDER — PANTOPRAZOLE SODIUM 40 MG PO TBEC
80.0000 mg | DELAYED_RELEASE_TABLET | Freq: Every day | ORAL | Status: DC
  Administered 2014-05-18: 80 mg via ORAL
  Filled 2014-05-17: qty 2

## 2014-05-17 MED ORDER — SENNA 8.6 MG PO TABS
1.0000 | ORAL_TABLET | Freq: Two times a day (BID) | ORAL | Status: DC
  Administered 2014-05-17 – 2014-05-18 (×3): 8.6 mg via ORAL
  Filled 2014-05-17 (×4): qty 1

## 2014-05-17 MED ORDER — POLYETHYLENE GLYCOL 3350 17 G PO PACK
17.0000 g | PACK | Freq: Every day | ORAL | Status: DC | PRN
  Filled 2014-05-17: qty 1

## 2014-05-17 MED ORDER — PROPOFOL 10 MG/ML IV BOLUS
INTRAVENOUS | Status: AC
  Filled 2014-05-17: qty 20

## 2014-05-17 MED ORDER — FENTANYL CITRATE 0.05 MG/ML IJ SOLN
INTRAMUSCULAR | Status: AC
  Filled 2014-05-17: qty 5

## 2014-05-17 MED ORDER — ALPRAZOLAM 0.5 MG PO TABS
0.5000 mg | ORAL_TABLET | Freq: Two times a day (BID) | ORAL | Status: DC
  Administered 2014-05-17 – 2014-05-18 (×2): 0.5 mg via ORAL
  Filled 2014-05-17 (×2): qty 1

## 2014-05-17 MED ORDER — LIDOCAINE HCL (CARDIAC) 20 MG/ML IV SOLN
INTRAVENOUS | Status: AC
  Filled 2014-05-17: qty 10

## 2014-05-17 MED ORDER — SUCCINYLCHOLINE CHLORIDE 20 MG/ML IJ SOLN
INTRAMUSCULAR | Status: AC
  Filled 2014-05-17: qty 1

## 2014-05-17 MED ORDER — MIDAZOLAM HCL 5 MG/5ML IJ SOLN
INTRAMUSCULAR | Status: DC | PRN
  Administered 2014-05-17: 2 mg via INTRAVENOUS

## 2014-05-17 MED ORDER — POTASSIUM CHLORIDE IN NACL 20-0.9 MEQ/L-% IV SOLN
INTRAVENOUS | Status: DC
  Filled 2014-05-17 (×4): qty 1000

## 2014-05-17 MED ORDER — VENLAFAXINE HCL ER 150 MG PO CP24
150.0000 mg | ORAL_CAPSULE | Freq: Every day | ORAL | Status: DC
  Administered 2014-05-18: 150 mg via ORAL
  Filled 2014-05-17 (×2): qty 1

## 2014-05-17 MED ORDER — GLYCOPYRROLATE 0.2 MG/ML IJ SOLN
INTRAMUSCULAR | Status: AC
  Filled 2014-05-17: qty 4

## 2014-05-17 MED ORDER — ACETAMINOPHEN 650 MG RE SUPP: 650.0000 mg | RECTAL | Status: DC | PRN

## 2014-05-17 MED ORDER — OXYCODONE HCL 5 MG PO TABS: 5.0000 mg | ORAL_TABLET | Freq: Once | ORAL | Status: DC | PRN

## 2014-05-17 MED ORDER — 0.9 % SODIUM CHLORIDE (POUR BTL) OPTIME
TOPICAL | Status: DC | PRN
  Administered 2014-05-17: 1000 mL

## 2014-05-17 MED ORDER — SODIUM CHLORIDE 0.9 % IJ SOLN
3.0000 mL | Freq: Two times a day (BID) | INTRAMUSCULAR | Status: DC
  Administered 2014-05-17 (×2): 3 mL via INTRAVENOUS

## 2014-05-17 MED ORDER — HEMOSTATIC AGENTS (NO CHARGE) OPTIME
TOPICAL | Status: DC | PRN
  Administered 2014-05-17: 1 via TOPICAL

## 2014-05-17 MED ORDER — GLYCOPYRROLATE 0.2 MG/ML IJ SOLN
INTRAMUSCULAR | Status: DC | PRN
  Administered 2014-05-17: .8 mg via INTRAVENOUS

## 2014-05-17 MED ORDER — MENTHOL 3 MG MT LOZG: 1.0000 | LOZENGE | OROMUCOSAL | Status: DC | PRN

## 2014-05-17 MED ORDER — PROMETHAZINE HCL 25 MG/ML IJ SOLN: 6.2500 mg | INTRAMUSCULAR | Status: DC | PRN

## 2014-05-17 MED ORDER — THROMBIN 5000 UNITS EX SOLR
CUTANEOUS | Status: DC | PRN
  Administered 2014-05-17 (×2): 5000 [IU] via TOPICAL

## 2014-05-17 MED ORDER — LIDOCAINE-EPINEPHRINE 0.5 %-1:200000 IJ SOLN
INTRAMUSCULAR | Status: DC | PRN
  Administered 2014-05-17: 7 mL

## 2014-05-17 MED ORDER — ONDANSETRON HCL 4 MG/2ML IJ SOLN: 4.0000 mg | INTRAMUSCULAR | Status: DC | PRN

## 2014-05-17 MED ORDER — OXYCODONE HCL 5 MG/5ML PO SOLN: 5.0000 mg | Freq: Once | ORAL | Status: DC | PRN

## 2014-05-17 MED ORDER — NEOSTIGMINE METHYLSULFATE 10 MG/10ML IV SOLN
INTRAVENOUS | Status: AC
  Filled 2014-05-17: qty 1

## 2014-05-17 MED ORDER — METHOCARBAMOL 500 MG PO TABS
500.0000 mg | ORAL_TABLET | Freq: Two times a day (BID) | ORAL | Status: DC
  Administered 2014-05-17 – 2014-05-18 (×3): 500 mg via ORAL
  Filled 2014-05-17 (×4): qty 1

## 2014-05-17 MED ORDER — HYDROCODONE-ACETAMINOPHEN 5-325 MG PO TABS
1.0000 | ORAL_TABLET | ORAL | Status: DC | PRN
  Administered 2014-05-17: 2 via ORAL
  Administered 2014-05-17: 1 via ORAL
  Administered 2014-05-18: 2 via ORAL
  Filled 2014-05-17 (×2): qty 2
  Filled 2014-05-17: qty 1

## 2014-05-17 MED ORDER — LIDOCAINE HCL (CARDIAC) 20 MG/ML IV SOLN
INTRAVENOUS | Status: DC | PRN
  Administered 2014-05-17: 100 mg via INTRAVENOUS

## 2014-05-17 MED ORDER — LACTATED RINGERS IV SOLN
INTRAVENOUS | Status: DC | PRN
  Administered 2014-05-17: 08:00:00 via INTRAVENOUS

## 2014-05-17 MED ORDER — EPHEDRINE SULFATE 50 MG/ML IJ SOLN
INTRAMUSCULAR | Status: AC
  Filled 2014-05-17: qty 1

## 2014-05-17 MED ORDER — ONDANSETRON HCL 4 MG/2ML IJ SOLN
INTRAMUSCULAR | Status: AC
  Filled 2014-05-17: qty 2

## 2014-05-17 MED ORDER — OXYCODONE-ACETAMINOPHEN 5-325 MG PO TABS
1.0000 | ORAL_TABLET | ORAL | Status: DC | PRN
  Administered 2014-05-18 (×2): 2 via ORAL
  Filled 2014-05-17 (×2): qty 2

## 2014-05-17 MED ORDER — ROCURONIUM BROMIDE 50 MG/5ML IV SOLN
INTRAVENOUS | Status: AC
  Filled 2014-05-17: qty 1

## 2014-05-17 MED ORDER — NEOSTIGMINE METHYLSULFATE 10 MG/10ML IV SOLN
INTRAVENOUS | Status: DC | PRN
Start: 2014-05-17 — End: 2014-05-17
  Administered 2014-05-17: 5 mg via INTRAVENOUS

## 2014-05-17 MED ORDER — PROPRANOLOL HCL 80 MG PO TABS
80.0000 mg | ORAL_TABLET | Freq: Every day | ORAL | Status: DC
  Administered 2014-05-18: 80 mg via ORAL
  Filled 2014-05-17: qty 1

## 2014-05-17 MED ORDER — ONDANSETRON HCL 4 MG/2ML IJ SOLN
INTRAMUSCULAR | Status: DC | PRN
  Administered 2014-05-17: 4 mg via INTRAVENOUS

## 2014-05-17 MED ORDER — PHENOL 1.4 % MT LIQD
1.0000 | OROMUCOSAL | Status: DC | PRN
  Filled 2014-05-17: qty 177

## 2014-05-17 MED ORDER — ALBUTEROL SULFATE (2.5 MG/3ML) 0.083% IN NEBU: 2.5000 mg | INHALATION_SOLUTION | Freq: Four times a day (QID) | RESPIRATORY_TRACT | Status: DC | PRN

## 2014-05-17 MED ORDER — LIDOCAINE HCL 4 % MT SOLN
OROMUCOSAL | Status: DC | PRN
  Administered 2014-05-17: 4 mL via TOPICAL

## 2014-05-17 MED ORDER — SODIUM CHLORIDE 0.9 % IJ SOLN: 3.0000 mL | INTRAMUSCULAR | Status: DC | PRN

## 2014-05-17 MED ORDER — PROPOFOL 10 MG/ML IV BOLUS
INTRAVENOUS | Status: DC | PRN
  Administered 2014-05-17: 50 mg via INTRAVENOUS
  Administered 2014-05-17: 250 mg via INTRAVENOUS

## 2014-05-17 MED ORDER — FENTANYL CITRATE 0.05 MG/ML IJ SOLN
INTRAMUSCULAR | Status: DC | PRN
  Administered 2014-05-17 (×3): 50 ug via INTRAVENOUS

## 2014-05-17 MED ORDER — ATOMOXETINE HCL 40 MG PO CAPS
80.0000 mg | ORAL_CAPSULE | Freq: Every day | ORAL | Status: DC
  Administered 2014-05-18: 80 mg via ORAL
  Filled 2014-05-17 (×2): qty 2

## 2014-05-17 SURGICAL SUPPLY — 73 items
BIT DRILL NEURO 2X3.1 SFT TUCH (MISCELLANEOUS) ×1 IMPLANT
BLADE CLIPPER SURG (BLADE) IMPLANT
BNDG GAUZE ELAST 4 BULKY (GAUZE/BANDAGES/DRESSINGS) ×4 IMPLANT
BUR DRUM 4.0 (BURR) ×2 IMPLANT
CANISTER SUCT 3000ML PPV (MISCELLANEOUS) ×2 IMPLANT
CONT SPEC 4OZ CLIKSEAL STRL BL (MISCELLANEOUS) ×2 IMPLANT
DECANTER SPIKE VIAL GLASS SM (MISCELLANEOUS) ×2 IMPLANT
DRAPE LAPAROTOMY 100X72 PEDS (DRAPES) ×2 IMPLANT
DRAPE MICROSCOPE LEICA (MISCELLANEOUS) ×2 IMPLANT
DRAPE POUCH INSTRU U-SHP 10X18 (DRAPES) ×2 IMPLANT
DRAPE PROXIMA HALF (DRAPES) ×2 IMPLANT
DRILL NEURO 2X3.1 SOFT TOUCH (MISCELLANEOUS) ×2
DURAPREP 6ML APPLICATOR 50/CS (WOUND CARE) ×2 IMPLANT
ELECT COATED BLADE 2.86 ST (ELECTRODE) ×2 IMPLANT
ELECT REM PT RETURN 9FT ADLT (ELECTROSURGICAL) ×2
ELECTRODE REM PT RTRN 9FT ADLT (ELECTROSURGICAL) ×1 IMPLANT
GAUZE SPONGE 4X4 16PLY XRAY LF (GAUZE/BANDAGES/DRESSINGS) IMPLANT
GLOVE BIO SURGEON STRL SZ 6.5 (GLOVE) IMPLANT
GLOVE BIO SURGEON STRL SZ7 (GLOVE) IMPLANT
GLOVE BIO SURGEON STRL SZ7.5 (GLOVE) IMPLANT
GLOVE BIO SURGEON STRL SZ8 (GLOVE) IMPLANT
GLOVE BIO SURGEON STRL SZ8.5 (GLOVE) IMPLANT
GLOVE BIOGEL M 8.0 STRL (GLOVE) IMPLANT
GLOVE BIOGEL PI IND STRL 7.5 (GLOVE) ×1 IMPLANT
GLOVE BIOGEL PI IND STRL 8.5 (GLOVE) IMPLANT
GLOVE BIOGEL PI INDICATOR 7.5 (GLOVE) ×1
GLOVE BIOGEL PI INDICATOR 8.5 (GLOVE)
GLOVE ECLIPSE 6.5 STRL STRAW (GLOVE) IMPLANT
GLOVE ECLIPSE 7.0 STRL STRAW (GLOVE) IMPLANT
GLOVE ECLIPSE 7.5 STRL STRAW (GLOVE) IMPLANT
GLOVE ECLIPSE 8.0 STRL XLNG CF (GLOVE) IMPLANT
GLOVE ECLIPSE 8.5 STRL (GLOVE) IMPLANT
GLOVE EXAM NITRILE LRG STRL (GLOVE) IMPLANT
GLOVE EXAM NITRILE MD LF STRL (GLOVE) IMPLANT
GLOVE EXAM NITRILE XL STR (GLOVE) IMPLANT
GLOVE EXAM NITRILE XS STR PU (GLOVE) IMPLANT
GLOVE INDICATOR 6.5 STRL GRN (GLOVE) IMPLANT
GLOVE INDICATOR 7.0 STRL GRN (GLOVE) IMPLANT
GLOVE INDICATOR 7.5 STRL GRN (GLOVE) IMPLANT
GLOVE INDICATOR 8.0 STRL GRN (GLOVE) IMPLANT
GLOVE INDICATOR 8.5 STRL (GLOVE) IMPLANT
GLOVE OPTIFIT SS 8.0 STRL (GLOVE) IMPLANT
GLOVE SURG SS PI 6.5 STRL IVOR (GLOVE) ×2 IMPLANT
GLOVE SURG SS PI 7.0 STRL IVOR (GLOVE) ×4 IMPLANT
GLOVE SURG SS PI 8.0 STRL IVOR (GLOVE) ×2 IMPLANT
GOWN STRL REUS W/ TWL LRG LVL3 (GOWN DISPOSABLE) ×1 IMPLANT
GOWN STRL REUS W/ TWL XL LVL3 (GOWN DISPOSABLE) ×2 IMPLANT
GOWN STRL REUS W/TWL 2XL LVL3 (GOWN DISPOSABLE) IMPLANT
GOWN STRL REUS W/TWL LRG LVL3 (GOWN DISPOSABLE) ×1
GOWN STRL REUS W/TWL XL LVL3 (GOWN DISPOSABLE) ×2
KIT BASIN OR (CUSTOM PROCEDURE TRAY) ×2 IMPLANT
KIT ROOM TURNOVER OR (KITS) ×2 IMPLANT
LIQUID BAND (GAUZE/BANDAGES/DRESSINGS) ×2 IMPLANT
NEEDLE HYPO 25X1 1.5 SAFETY (NEEDLE) ×2 IMPLANT
NEEDLE SPNL 22GX3.5 QUINCKE BK (NEEDLE) ×6 IMPLANT
NS IRRIG 1000ML POUR BTL (IV SOLUTION) ×2 IMPLANT
PACK LAMINECTOMY NEURO (CUSTOM PROCEDURE TRAY) ×2 IMPLANT
PAD ARMBOARD 7.5X6 YLW CONV (MISCELLANEOUS) ×4 IMPLANT
PIN DISTRACTION 14MM (PIN) IMPLANT
PLATE 23MM (Plate) ×2 IMPLANT
RUBBERBAND STERILE (MISCELLANEOUS) ×4 IMPLANT
SCREW ST 13X4XST VA NS SPNE (Screw) ×4 IMPLANT
SCREW ST VAR 4 ATL (Screw) ×4 IMPLANT
SPACER ACF PARALLEL 7MM (Bone Implant) ×2 IMPLANT
SPONGE INTESTINAL PEANUT (DISPOSABLE) ×2 IMPLANT
SPONGE SURGIFOAM ABS GEL SZ50 (HEMOSTASIS) ×2 IMPLANT
SUT VIC AB 0 CT1 27 (SUTURE) ×1
SUT VIC AB 0 CT1 27XBRD ANTBC (SUTURE) ×1 IMPLANT
SUT VIC AB 3-0 SH 8-18 (SUTURE) ×4 IMPLANT
SYR 20ML ECCENTRIC (SYRINGE) ×2 IMPLANT
TOWEL OR 17X24 6PK STRL BLUE (TOWEL DISPOSABLE) ×2 IMPLANT
TOWEL OR 17X26 10 PK STRL BLUE (TOWEL DISPOSABLE) ×2 IMPLANT
WATER STERILE IRR 1000ML POUR (IV SOLUTION) ×2 IMPLANT

## 2014-05-17 NOTE — Anesthesia Preprocedure Evaluation (Addendum)
Anesthesia Evaluation  Patient identified by MRN, date of birth, ID band Patient awake    Reviewed: Allergy & Precautions, NPO status , Patient's Chart, lab work & pertinent test results  Airway Mallampati: IV  TM Distance: >3 FB Neck ROM: Limited    Dental  (+) Poor Dentition, Dental Advisory Given   Pulmonary asthma , sleep apnea , Current Smoker,  breath sounds clear to auscultation        Cardiovascular hypertension, Pt. on medications and Pt. on home beta blockers Rhythm:Regular Rate:Normal     Neuro/Psych  Headaches, Anxiety Depression    GI/Hepatic Neg liver ROS, GERD-  ,  Endo/Other  Morbid obesity  Renal/GU negative Renal ROS     Musculoskeletal negative musculoskeletal ROS (+)   Abdominal   Peds  Hematology negative hematology ROS (+)   Anesthesia Other Findings   Reproductive/Obstetrics                           Anesthesia Physical Anesthesia Plan  ASA: II  Anesthesia Plan: General   Post-op Pain Management:    Induction: Intravenous  Airway Management Planned: Oral ETT and Video Laryngoscope Planned  Additional Equipment:   Intra-op Plan:   Post-operative Plan: Extubation in OR  Informed Consent: I have reviewed the patients History and Physical, chart, labs and discussed the procedure including the risks, benefits and alternatives for the proposed anesthesia with the patient or authorized representative who has indicated his/her understanding and acceptance.   Dental advisory given  Plan Discussed with: CRNA, Anesthesiologist and Surgeon  Anesthesia Plan Comments:       Anesthesia Quick Evaluation
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
## 2014-05-17 NOTE — Progress Notes (Signed)
Transported per [facility], NT 

## 2014-05-17 NOTE — Anesthesia Postprocedure Evaluation (Signed)
  Anesthesia Post-op Note  Patient: Gary Higgins  Procedure(s) Performed: Procedure(s) with comments: Cervical five-six anterior cervical decompression with fusion plating and bonegraft (N/A) - Cervical five-six anterior cervical decompression with fusion plating and bonegraft  Patient Location: PACU  Anesthesia Type:General  Level of Consciousness: awake and alert   Airway and Oxygen Therapy: Patient Spontanous Breathing  Post-op Pain: none  Post-op Assessment: Post-op Vital signs reviewed  Post-op Vital Signs: Reviewed  Last Vitals:  Filed Vitals:   05/17/14 1216  BP: 104/84  Pulse: 64  Temp: 36.6 C  Resp: 18    Complications: No apparent anesthesia complications
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
## 2014-05-17 NOTE — Anesthesia Procedure Notes (Signed)
Procedure Name: Intubation Date/Time: 05/17/2014 8:55 AM Performed by: Dairl PonderJIANG, Egon Dittus Pre-anesthesia Checklist: Patient identified, Timeout performed, Emergency Drugs available, Suction available and Patient being monitored Patient Re-evaluated:Patient Re-evaluated prior to inductionOxygen Delivery Method: Circle system utilized Preoxygenation: Pre-oxygenation with 100% oxygen Intubation Type: IV induction Ventilation: Mask ventilation without difficulty and Oral airway inserted - appropriate to patient size Laryngoscope Size: Glidescope and 4 (limited neck ROM) Grade View: Grade II Tube type: Oral Tube size: 7.5 mm Number of attempts: 1 Placement Confirmation: ETT inserted through vocal cords under direct vision,  breath sounds checked- equal and bilateral and positive ETCO2 Secured at: 23 cm Tube secured with: Tape Dental Injury: Teeth and Oropharynx as per pre-operative assessment

## 2014-05-17 NOTE — Transfer of Care (Signed)
Immediate Anesthesia Transfer of Care Note  Patient: Gary Higgins  Procedure(s) Performed: Procedure(s) with comments: Cervical five-six anterior cervical decompression with fusion plating and bonegraft (N/A) - Cervical five-six anterior cervical decompression with fusion plating and bonegraft  Patient Location: PACU  Anesthesia Type:General  Level of Consciousness: awake  Airway & Oxygen Therapy: Patient Spontanous Breathing and Patient connected to face mask oxygen  Post-op Assessment: Report given to RN and Post -op Vital signs reviewed and stable  Post vital signs: Reviewed and stable  Last Vitals:  Filed Vitals:   05/17/14 1043  BP:   Pulse: 82  Temp:   Resp: 30    Complications: No apparent anesthesia complications

## 2014-05-17 NOTE — Op Note (Signed)
05/17/2014  10:35 AM  PATIENT:  Gary Higgins  34 y.o. 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 was taken to the operating room, intubated, and placed under general anesthesia without difficulty. He was positioned supine with his head in slight extension on a horseshoe headrest. The neck was prepped and draped in a sterile manner. I infiltrated 7 cc's 1/2%lidocaine/1:200,000 strength epinephrine into the planned incision starting from the midline to the medial border of the left sternocleidomastoid muscle. I opened the incision with a 10 blade and dissected sharply through soft tissue to the platysma. I dissected in the plane superior to the platysma both rostrally and caudally. I then opened the platysma in a horizontal fashion with Metzenbaum scissors, and dissected in the inferior plane rostrally and caudally. With both blunt and sharp technique I created an avascular corridor to the cervical spine. I placed a spinal needle(s) in the disc space at C5/6 . I then reflected the longus colli from C5 to C6 and placed self retaining retractors. I opened the disc space(s) at C5/6 with a 15 blade. I removed disc with curettes, Kerrison punches, and the drill. Using the drill I removed osteophytes and prepared for the decompression.  I decompressed the spinal canal and the C6 root(s) with the drill, Kerrison punches, and the curettes. I used the microscope to aid in microdissection. I removed the posterior longitudinal ligament to  fully expose and decompress the thecal sac. I exposed the roots laterally taking down the C5/6 uncovertebral joints. With the decompression complete We moved on to the arthrodesis. We used the drill to level the surfaces of C5/6. I removed soft tissue to prepare the disc space and the bony surfaces. I measured the space and placed a 7mm structural allograft into the disc space.  We then placed the anterior instrumentation. I placed 2 screws in each vertebral body through the plate. I locked the screws into place. Intraoperative xray showed the graft, plate, and screws to be in good position. I irrigated the wound, achieved hemostasis, and closed the wound in layers. I approximated the platysma, and the subcuticular plane with vicryl sutures. I used Dermabond for a sterile dressing.   PLAN OF CARE: Admit for overnight observation  PATIENT DISPOSITION:  PACU - hemodynamically stable.   Delay start of Pharmacological VTE agent (>24hrs) due to surgical blood loss or risk of bleeding:  yes

## 2014-05-17 NOTE — Plan of Care (Signed)
Problem: Consults Goal: Diagnosis - Spinal Surgery Outcome: Completed/Met Date Met:  05/17/14 Cervical Spine Fusion

## 2014-05-18 ENCOUNTER — Encounter (HOSPITAL_COMMUNITY): Payer: Self-pay | Admitting: Neurosurgery

## 2014-05-18 DIAGNOSIS — M5022 Other cervical disc displacement, mid-cervical region: Secondary | ICD-10-CM | POA: Diagnosis not present

## 2014-05-18 MED ORDER — CYCLOBENZAPRINE HCL 10 MG PO TABS: 10.0000 mg | ORAL_TABLET | Freq: Three times a day (TID) | ORAL | Status: DC | PRN

## 2014-05-18 MED ORDER — OXYCODONE-ACETAMINOPHEN 5-325 MG PO TABS: 1.0000 | ORAL_TABLET | Freq: Four times a day (QID) | ORAL | Status: DC | PRN
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
## 2014-05-18 NOTE — Progress Notes (Signed)
Pt doing well. Pt given D/C instructions with Rx's, verbal understanding was provided. Pt's incision is open to air and is clean and dry with no sign of infection. Pt D/C'd home via walking @ 1615 per MD order. Pt is stable @ D/C and has no other needs at this time. Rema FendtAshley Olivia Royse, RN

## 2014-05-18 NOTE — Discharge Instructions (Signed)

## 2014-05-18 NOTE — Discharge Summary (Signed)
  Physician Discharge Summary  Patient ID: Gary [name] MRN: [id number] DOB/AGE: Jun 05, 1980 34 y.o.  Admit date: 05/17/2014 Discharge date: 05/18/2014  Admission Diagnoses:cervical herniated disc C5/6   Discharge Diagnoses: cervical herniated disc C5/6  Active Problems:   HNP (herniated nucleus pulposus), cervical   Discharged Condition: good  Hospital Course: [name] was admitted and taken to the operating room for an uncomplicated ACDF at C5/6. He is voiding, tolerating a regular diet, and ambulating without difficulty. His wound is clean, dry, and without signs of infection. Strength is full in the upper extremities. His voice is strong. He will be discharged today.   Treatments: surgery: Anterior Cervical decompression C5/6 Arthrodesis C5/6 with 7mm structural allograft Anterior instrumentation(Medtronic) C5/6   Discharge Exam: Blood pressure 126/83, pulse 83, temperature 98.2 F (36.8 C), temperature source Oral, resp. rate 18, weight 111.585 kg (246 lb), SpO2 97 %. General appearance: alert, cooperative, appears stated age and no distress Neurologic: Alert and oriented X 3, normal strength and tone. Normal symmetric reflexes. Normal coordination and gait  Disposition: 01-Home or Self Care cervical herniated disc    Medication List    TAKE these medications        albuterol 108 (90 BASE) MCG/ACT inhaler  Commonly known as:  PROVENTIL HFA;VENTOLIN HFA  Inhale 2 puffs into the lungs every 6 (six) hours as needed.     ALPRAZolam 0.5 MG tablet  Commonly known as:  XANAX  Take 0.5 mg by mouth 2 (two) times daily.     atomoxetine 80 MG capsule  Commonly known as:  STRATTERA  Take 80 mg by mouth daily.     cyclobenzaprine 10 MG tablet  Commonly known as:  FLEXERIL  Take 1 tablet (10 mg total) by mouth 3 (three) times daily as needed for muscle spasms.     ibuprofen 800 MG tablet  Commonly known as:  ADVIL,MOTRIN  Take 1 tablet (800 mg total) by mouth 3  (three) times daily.     methocarbamol 500 MG tablet  Commonly known as:  ROBAXIN  Take 500 mg by mouth 2 (two) times daily.     omeprazole 40 MG capsule  Commonly known as:  PRILOSEC  Take 40 mg by mouth daily.     oxyCODONE-acetaminophen 5-325 MG per tablet  Commonly known as:  ROXICET  Take 1 tablet by mouth every 6 (six) hours as needed for severe pain.     propranolol 80 MG tablet  Commonly known as:  INDERAL  Take 80 mg by mouth every morning.     traMADol 50 MG tablet  Commonly known as:  ULTRAM  Take 50 mg by mouth every 6 (six) hours as needed.     venlafaxine XR 150 MG 24 hr capsule  Commonly known as:  EFFEXOR-XR  Take 150 mg by mouth daily with breakfast.           Follow-up Information    Follow up with Joyelle Siedlecki L, MD In 3 weeks.   Specialty:  Neurosurgery   Why:  call office to make an appointment   Contact information:   [address] [place] [address] [phone]       Signed: Carmela HurtCABBELL,Kyonna Frier L 05/18/2014, 3:36 PM

## 2014-12-23 ENCOUNTER — Encounter (HOSPITAL_COMMUNITY): Payer: Self-pay | Admitting: Emergency Medicine

## 2014-12-23 ENCOUNTER — Emergency Department (HOSPITAL_COMMUNITY): Payer: Medicaid Other

## 2014-12-23 ENCOUNTER — Emergency Department (HOSPITAL_COMMUNITY)
Admission: EM | Admit: 2014-12-23 | Discharge: 2014-12-23 | Disposition: A | Discharge status: Home / Self Care | Payer: Medicaid Other | Attending: Emergency Medicine | Admitting: Emergency Medicine

## 2014-12-23 DIAGNOSIS — K029 Dental caries, unspecified: Secondary | ICD-10-CM | POA: Insufficient documentation

## 2014-12-23 DIAGNOSIS — Z79899 Other long term (current) drug therapy: Secondary | ICD-10-CM | POA: Insufficient documentation

## 2014-12-23 DIAGNOSIS — R000 Tachycardia, unspecified: Secondary | ICD-10-CM | POA: Diagnosis not present

## 2014-12-23 DIAGNOSIS — Z720 Tobacco use: Secondary | ICD-10-CM | POA: Insufficient documentation

## 2014-12-23 DIAGNOSIS — G43909 Migraine, unspecified, not intractable, without status migrainosus: Secondary | ICD-10-CM | POA: Insufficient documentation

## 2014-12-23 DIAGNOSIS — G4730 Sleep apnea, unspecified: Secondary | ICD-10-CM | POA: Insufficient documentation

## 2014-12-23 DIAGNOSIS — F329 Major depressive disorder, single episode, unspecified: Secondary | ICD-10-CM | POA: Diagnosis not present

## 2014-12-23 DIAGNOSIS — J45909 Unspecified asthma, uncomplicated: Secondary | ICD-10-CM | POA: Diagnosis not present

## 2014-12-23 DIAGNOSIS — K529 Noninfective gastroenteritis and colitis, unspecified: Secondary | ICD-10-CM

## 2014-12-23 DIAGNOSIS — F419 Anxiety disorder, unspecified: Secondary | ICD-10-CM | POA: Insufficient documentation

## 2014-12-23 DIAGNOSIS — Z9981 Dependence on supplemental oxygen: Secondary | ICD-10-CM | POA: Insufficient documentation

## 2014-12-23 DIAGNOSIS — I10 Essential (primary) hypertension: Secondary | ICD-10-CM | POA: Diagnosis not present

## 2014-12-23 DIAGNOSIS — Z91040 Latex allergy status: Secondary | ICD-10-CM | POA: Diagnosis not present

## 2014-12-23 DIAGNOSIS — K219 Gastro-esophageal reflux disease without esophagitis: Secondary | ICD-10-CM | POA: Diagnosis not present

## 2014-12-23 DIAGNOSIS — R079 Chest pain, unspecified: Secondary | ICD-10-CM | POA: Insufficient documentation

## 2014-12-23 DIAGNOSIS — K047 Periapical abscess without sinus: Secondary | ICD-10-CM | POA: Diagnosis not present

## 2014-12-23 DIAGNOSIS — Z791 Long term (current) use of non-steroidal anti-inflammatories (NSAID): Secondary | ICD-10-CM | POA: Diagnosis not present

## 2014-12-23 DIAGNOSIS — K0889 Other specified disorders of teeth and supporting structures: Secondary | ICD-10-CM | POA: Diagnosis present

## 2014-12-23 LAB — CBC WITH DIFFERENTIAL/PLATELET
Band Neutrophils: 0 %
Basophils Absolute: 0 10*3/uL (ref 0.0–0.1)
Basophils Relative: 0 %
Blasts: 0 %
Eosinophils Absolute: 1.1 10*3/uL — ABNORMAL HIGH (ref 0.0–0.7)
Eosinophils Relative: 8 %
HCT: 44.9 % (ref 39.0–52.0)
Hemoglobin: 16 g/dL (ref 13.0–17.0)
Lymphocytes Relative: 22 %
Lymphs Abs: 3 10*3/uL (ref 0.7–4.0)
MCH: 30.5 pg (ref 26.0–34.0)
MCHC: 35.6 g/dL (ref 30.0–36.0)
MCV: 85.7 fL (ref 78.0–100.0)
Metamyelocytes Relative: 0 %
Monocytes Absolute: 1.2 10*3/uL — ABNORMAL HIGH (ref 0.1–1.0)
Monocytes Relative: 9 %
Myelocytes: 0 %
Neutro Abs: 8.3 10*3/uL — ABNORMAL HIGH (ref 1.7–7.7)
Neutrophils Relative %: 61 %
Other: 0 %
Platelets: 264 10*3/uL (ref 150–400)
Promyelocytes Absolute: 0 %
RBC: 5.24 MIL/uL (ref 4.22–5.81)
RDW: 12.5 % (ref 11.5–15.5)
WBC: 13.6 10*3/uL — ABNORMAL HIGH (ref 4.0–10.5)
nRBC: 0 /100 WBC

## 2014-12-23 LAB — COMPREHENSIVE METABOLIC PANEL
ALT: 62 U/L (ref 17–63)
AST: 31 U/L (ref 15–41)
Albumin: 4.6 g/dL (ref 3.5–5.0)
Alkaline Phosphatase: 92 U/L (ref 38–126)
Anion gap: 10 (ref 5–15)
BUN: <5 mg/dL — ABNORMAL LOW (ref 6–20)
CO2: 28 mmol/L (ref 22–32)
Calcium: 9.3 mg/dL (ref 8.9–10.3)
Chloride: 100 mmol/L — ABNORMAL LOW (ref 101–111)
Creatinine, Ser: 0.75 mg/dL (ref 0.61–1.24)
GFR calc Af Amer: >60 mL/min (ref >60)
GFR calc non Af Amer: >60 mL/min (ref >60)
Glucose, Bld: 108 mg/dL — ABNORMAL HIGH (ref 65–99)
Potassium: 3.4 mmol/L — ABNORMAL LOW (ref 3.5–5.1)
Sodium: 138 mmol/L (ref 135–145)
Total Bilirubin: 0.7 mg/dL (ref 0.3–1.2)
Total Protein: 7.6 g/dL (ref 6.5–8.1)

## 2014-12-23 LAB — LIPASE, BLOOD: Lipase: 65 U/L — ABNORMAL HIGH (ref 22–51)

## 2014-12-23 LAB — TROPONIN I: Troponin I: <0.03 ng/mL (ref <0.031)

## 2014-12-23 LAB — D-DIMER, QUANTITATIVE: D-Dimer, Quant: 0.27 ug/mL-FEU (ref 0.00–0.48)

## 2014-12-23 MED ORDER — MORPHINE SULFATE (PF) 4 MG/ML IV SOLN
4.0000 mg | Freq: Once | INTRAVENOUS | Status: AC
  Administered 2014-12-23: 4 mg via INTRAVENOUS
  Filled 2014-12-23: qty 1

## 2014-12-23 MED ORDER — AMOXICILLIN 500 MG PO CAPS: 500.0000 mg | ORAL_CAPSULE | Freq: Three times a day (TID) | ORAL | Status: DC

## 2014-12-23 MED ORDER — HYDROCODONE-ACETAMINOPHEN 5-325 MG PO TABS
1.0000 | ORAL_TABLET | Freq: Once | ORAL | Status: AC
  Administered 2014-12-23: 1 via ORAL
  Filled 2014-12-23: qty 1

## 2014-12-23 MED ORDER — ONDANSETRON HCL 4 MG/2ML IJ SOLN
4.0000 mg | Freq: Once | INTRAMUSCULAR | Status: AC
  Administered 2014-12-23: 4 mg via INTRAVENOUS
  Filled 2014-12-23: qty 2

## 2014-12-23 MED ORDER — IOHEXOL 300 MG/ML  SOLN
100.0000 mL | Freq: Once | INTRAMUSCULAR | Status: AC | PRN
  Administered 2014-12-23: 100 mL via INTRAVENOUS

## 2014-12-23 MED ORDER — HYDROCODONE-ACETAMINOPHEN 5-325 MG PO TABS: 1.0000 | ORAL_TABLET | ORAL | Status: DC | PRN

## 2014-12-23 MED ORDER — IOHEXOL 300 MG/ML  SOLN
25.0000 mL | Freq: Once | INTRAMUSCULAR | Status: AC | PRN
  Administered 2014-12-23: 25 mL via ORAL

## 2014-12-23 MED ORDER — TRAMADOL HCL 50 MG PO TABS: 50.0000 mg | ORAL_TABLET | Freq: Once | ORAL | Status: DC

## 2014-12-23 MED ORDER — AMOXICILLIN 250 MG PO CAPS
500.0000 mg | ORAL_CAPSULE | Freq: Once | ORAL | Status: AC
  Administered 2014-12-23: 500 mg via ORAL
  Filled 2014-12-23: qty 2

## 2014-12-23 MED ORDER — HYDROCODONE-ACETAMINOPHEN 5-325 MG PO TABS
1.0000 | ORAL_TABLET | Freq: Once | ORAL | Status: AC
Start: 2014-12-23 — End: 2014-12-23
  Administered 2014-12-23: 1 via ORAL
  Filled 2014-12-23: qty 1

## 2014-12-23 MED ORDER — FAMOTIDINE 20 MG PO TABS: 20.0000 mg | ORAL_TABLET | Freq: Two times a day (BID) | ORAL | Status: DC

## 2014-12-23 MED ORDER — ONDANSETRON HCL 4 MG PO TABS: 4.0000 mg | ORAL_TABLET | Freq: Four times a day (QID) | ORAL | Status: DC

## 2014-12-23 MED ORDER — LIDOCAINE VISCOUS 2 % MT SOLN
15.0000 mL | Freq: Once | OROMUCOSAL | Status: AC
  Administered 2014-12-23: 15 mL via OROMUCOSAL
  Filled 2014-12-23: qty 15

## 2014-12-23 NOTE — Discharge Instructions (Signed)
Dental Abscess A dental abscess is a collection of pus in or around a tooth. CAUSES This condition is caused by a bacterial infection around the root of the tooth that involves the inner part of the tooth (pulp). It may result from:  Severe tooth decay.  Trauma to the tooth that allows bacteria to enter into the pulp, such as a broken or chipped tooth.  Severe gum disease around a tooth. SYMPTOMS Symptoms of this condition include:  Severe pain in and around the infected tooth.  Swelling and redness around the infected tooth, in the mouth, or in the face.  Tenderness.  Pus drainage.  Bad breath.  Bitter taste in the mouth.  Difficulty swallowing.  Difficulty opening the mouth.  Nausea.  Vomiting.  Chills.  Swollen neck glands.  Fever. DIAGNOSIS This condition is diagnosed with examination of the infected tooth. During the exam, your dentist may tap on the infected tooth. Your dentist will also ask about your medical and dental history and may order X-rays. TREATMENT This condition is treated by eliminating the infection. This may be done with:  Antibiotic medicine.  A root canal. This may be performed to save the tooth.  Pulling (extracting) the tooth. This may also involve draining the abscess. This is done if the tooth cannot be saved. HOME CARE INSTRUCTIONS  Take medicines only as directed by your dentist.  If you were prescribed antibiotic medicine, finish all of it even if you start to feel better.  Rinse your mouth (gargle) often with salt water to relieve pain or swelling.  Do not drive or operate heavy machinery while taking pain medicine.  Do not apply heat to the outside of your mouth.  Keep all follow-up visits as directed by your dentist. This is important. SEEK MEDICAL CARE IF:  Your pain is worse and is not helped by medicine. SEEK IMMEDIATE MEDICAL CARE IF:  You have a fever or chills.  Your symptoms suddenly get worse.  You have a  very bad headache.  You have problems breathing or swallowing.  You have trouble opening your mouth.  You have swelling in your neck or around your eye.   This information is not intended to replace advice given to you by your health care provider. Make sure you discuss any questions you have with your health care provider.   Document Released: 03/03/2005 Document Revised: 07/18/2014 Document Reviewed: 02/28/2014 Elsevier Interactive Patient Education 2016 [employer].   Complete your entire course of antibiotics as prescribed.  You  may use the hydrocodone for pain relief but do not drive within 4 hours of taking as this will make you drowsy.  Avoid applying heat or ice to this abscess area which can worsen your symptoms.  You may use warm salt water swish and spit treatment or half peroxide and water swish and spit after meals to keep this area clean as discussed.  Call the dentist listed above for further management of your symptoms.   Chest Pain Observation It is often hard to give a specific diagnosis for the cause of chest pain. Among other possibilities your symptoms might be caused by inadequate oxygen delivery to your heart (angina). Angina that is not treated or evaluated can lead to a heart attack (myocardial infarction) or death. Blood tests, electrocardiograms, and X-rays may have been done to help determine a possible cause of your chest pain. After evaluation and observation, your health care provider has determined that it is unlikely your pain was caused  by an unstable condition that requires hospitalization. However, a full evaluation of your pain may need to be completed, with additional diagnostic testing as directed. It is very important to keep your follow-up appointments. Not keeping your follow-up appointments could result in permanent heart damage, disability, or death. If there is any problem keeping your follow-up appointments, you must call your health care  provider. HOME CARE INSTRUCTIONS  Due to the slight chance that your pain could be angina, it is important to follow your health care provider's treatment plan and also maintain a healthy lifestyle:  Maintain or work toward achieving a healthy weight.  Stay physically active and exercise regularly.  Decrease your salt intake.  Eat a balanced, healthy diet. Talk to a dietitian to learn about heart-healthy foods.  Increase your fiber intake by including whole grains, vegetables, fruits, and nuts in your diet.  Avoid situations that cause stress, anger, or depression.  Take medicines as advised by your health care provider. Report any side effects to your health care provider. Do not stop medicines or adjust the dosages on your own.  Quit smoking. Do not use nicotine patches or gum until you check with your health care provider.  Keep your blood pressure, blood sugar, and cholesterol levels within normal limits.  Limit alcohol intake to no more than 1 drink per day for women who are not pregnant and 2 drinks per day for men.  Do not abuse drugs. SEEK IMMEDIATE MEDICAL CARE IF: You have severe chest pain or pressure which may include symptoms such as:  You feel pain or pressure in your arms, neck, jaw, or back.  You have severe back or abdominal pain, feel sick to your stomach (nauseous), or throw up (vomit).  You are sweating profusely.  You are having a fast or irregular heartbeat.  You feel short of breath while at rest.  You notice increasing shortness of breath during rest, sleep, or with activity.  You have chest pain that does not get better after rest or after taking your usual medicine.  You wake from sleep with chest pain.  You are unable to sleep because you cannot breathe.  You develop a frequent cough or you are coughing up blood.  You feel dizzy, faint, or experience extreme fatigue.  You develop severe weakness, dizziness, fainting, or chills. Any of  these symptoms may represent a serious problem that is an emergency. Do not wait to see if the symptoms will go away. Call your local emergency services (911 in the [place] Do not drive yourself to the hospital. MAKE SURE YOU:  Understand these instructions.  Will watch your condition.  Will get help right away if you are not doing well or get worse.   This information is not intended to replace advice given to you by your health care provider. Make sure you discuss any questions you have with your health care provider.   Document Released: 04/05/2010 Document Revised: 03/08/2013 Document Reviewed: 09/02/2012 Elsevier Interactive Patient Education [employer]

## 2014-12-23 NOTE — ED Notes (Signed)
Pt reports left sided dental pain x3 days. Pt reports history of dental problems. nad noted.

## 2014-12-23 NOTE — ED Provider Notes (Signed)
CSN: [id number]     Arrival date & time 12/23/14  1637 History   First MD Initiated Contact with Patient 12/23/14 1652     Chief Complaint  Patient presents with  . Dental Pain     (Consider location/radiation/quality/duration/timing/severity/associated sxs/prior Treatment) The history is provided by the patient and the spouse.    Gary Higgins is a 34 y.o. male with a history of hypertension, GERD, migraine headaches and chronic dental decay presenting with a 3 day history of severe left upper second molar pain. He denies any trauma or eliciting events, stating it just started throbbing and has been severe. He does see Dr. Blondell Reveal, called yesterday but the office was closed. He has taken ibuprofen 800 mg today without symptom relief. He denies fevers or chills. He does have pain and swelling along his left jaw and cheek area.   At time of discharge, patient's blood pressure was rechecked and was even higher than at first presentation at 210/114. About the time his pressure was rechecked, he vomited his Amoxil and hydrocodone tablets. He became tearful and stated he is having upper abdominal pain, sharp and crampy along with diarrhea since yesterday. This was his first episode of vomiting. He also endorses left chest pain which is described as an elephant sitting on his chest which has been very frequent now for several weeks.  His chest pain is intermittent, will last for several hours and reports feeling this for the most of the day today.  It is non exertional (has occurred at rest the past few days but also states sometimes it occurs with heavy lifting).  He has tried maalox thinking it was his acid reflux but this was not helpful.    He denies fevers or chills, but has had reduced appetite. He has taken his blood pressure medication today and states his blood pressures generally are much better control than today.    Past Medical History  Diagnosis Date  . Migraine   . Hypertension    . Sleep apnea 2014    CPAP study- uses CPAP q night, but currently machine is malfunctioning   - done at [facility]  . Depression   . Anxiety   . GERD (gastroesophageal reflux disease)   . Neuromuscular disorder (HCC)     CT compromise- L hand  . Asthma     occas. use of inhaler    Past Surgical History  Procedure Laterality Date  . Scrotal surgery      as a child- related to trauma  . Carpal tunnel release Right   . Wisdom tooth extraction    . Anterior cervical decomp/discectomy fusion N/A 05/17/2014    Procedure: Cervical five-six anterior cervical decompression with fusion plating and bonegraft;  Surgeon: Coletta Memos, MD;  Location: [facility];  Service: Neurosurgery;  Laterality: N/A;  Cervical five-six anterior cervical decompression with fusion plating and bonegraft   History reviewed. No pertinent family history. Social History  Substance Use Topics  . Smoking status: Current Every Day Smoker -- 0.50 packs/day  . Smokeless tobacco: Never Used  . Alcohol Use: No    Review of Systems  Constitutional: Negative for fever.  HENT: Positive for dental problem. Negative for facial swelling and sore throat.   Respiratory: Negative for shortness of breath.   Musculoskeletal: Negative for neck pain and neck stiffness.      Allergies  Bee venom; Latex; and Prednisone  Home Medications   Prior to Admission medications   Medication Sig  Start Date End Date Taking? Authorizing Provider  ALPRAZolam Prudy Feeler) 0.5 MG tablet Take 0.5 mg by mouth 2 (two) times daily.   Yes Historical Provider, MD  atomoxetine (STRATTERA) 80 MG capsule Take 80 mg by mouth daily.   Yes Historical Provider, MD  ibuprofen (ADVIL,MOTRIN) 800 MG tablet Take 1 tablet (800 mg total) by mouth 3 (three) times daily. 01/13/14  Yes Margarita Grizzle, MD  omeprazole (PRILOSEC) 40 MG capsule Take 40 mg by mouth daily.   Yes Historical Provider, MD  propranolol (INDERAL) 80 MG tablet Take 80 mg by mouth every morning.    Yes Historical Provider, MD  traMADol (ULTRAM) 50 MG tablet Take 50 mg by mouth every 6 (six) hours as needed for moderate pain or severe pain.    Yes Historical Provider, MD  venlafaxine XR (EFFEXOR-XR) 150 MG 24 hr capsule Take 150 mg by mouth daily with breakfast.   Yes Historical Provider, MD  amoxicillin (AMOXIL) 500 MG capsule Take 1 capsule (500 mg total) by mouth 3 (three) times daily. 12/23/14   Burgess Amor, PA-C  famotidine (PEPCID) 20 MG tablet Take 1 tablet (20 mg total) by mouth 2 (two) times daily. 12/23/14   Burgess Amor, PA-C  HYDROcodone-acetaminophen (NORCO/VICODIN) 5-325 MG tablet Take 1 tablet by mouth every 4 (four) hours as needed. 12/23/14   Burgess Amor, PA-C  ondansetron (ZOFRAN) 4 MG tablet Take 1 tablet (4 mg total) by mouth every 6 (six) hours. 12/23/14   Burgess Amor, PA-C   BP 177/112 mmHg  Pulse 108  Temp(Src) 97.9 F (36.6 C) (Oral)  Resp 24  Ht 6' (1.829 m)  Wt 235 lb (106.595 kg)  BMI 31.86 kg/m2  SpO2 96% Physical Exam  Constitutional: He is oriented to person, place, and time. He appears well-developed and well-nourished. No distress.  HENT:  Head: Normocephalic and atraumatic.  Right Ear: Tympanic membrane and external ear normal.  Left Ear: Tympanic membrane and external ear normal.  Mouth/Throat: Oropharynx is clear and moist and mucous membranes are normal. No oral lesions. No trismus in the jaw. Dental caries present.  Poor dentition, deep decay of occlusive surface of the left upper second molar tooth. There is mild lateral gingival edema without fluctuance. Tender to palpation along the left mandible. He does have left submandibular tenderness. Sublingual space is soft and nontender.  Eyes: Conjunctivae are normal.  Neck: Normal range of motion. Neck supple.  Cardiovascular: Normal heart sounds.  Tachycardia present.   Pulmonary/Chest: Effort normal.  Abdominal: He exhibits no distension. There is tenderness in the right upper quadrant, epigastric area and  left upper quadrant. There is no guarding.  Musculoskeletal: Normal range of motion.  Lymphadenopathy:    He has no cervical adenopathy.  Neurological: He is alert and oriented to person, place, and time.  Skin: Skin is warm and dry. No erythema.  Psychiatric: He has a normal mood and affect.    ED Course  Procedures (including critical care time) Labs Review Labs Reviewed  CBC WITH DIFFERENTIAL/PLATELET - Abnormal; Notable for the following:    WBC 13.6 (*)    Neutro Abs 8.3 (*)    Monocytes Absolute 1.2 (*)    Eosinophils Absolute 1.1 (*)    All other components within normal limits  COMPREHENSIVE METABOLIC PANEL - Abnormal; Notable for the following:    Potassium 3.4 (*)    Chloride 100 (*)    Glucose, Bld 108 (*)    BUN <5 (*)    All other components  within normal limits  LIPASE, BLOOD - Abnormal; Notable for the following:    Lipase 65 (*)    All other components within normal limits  TROPONIN I  D-DIMER, QUANTITATIVE (NOT AT [facility])    Imaging Review Ct Abdomen Pelvis W Contrast  12/23/2014   CLINICAL DATA:  Sharp intermittent epigastric abdominal pain and pressure, acute onset. Nausea. Initial encounter.  EXAM: CT ABDOMEN AND PELVIS WITH CONTRAST  TECHNIQUE: Multidetector CT imaging of the abdomen and pelvis was performed using the standard protocol following bolus administration of intravenous contrast.  CONTRAST:  OMNIPAQUE IOHEXOL 300 MG/ML  SOLN  COMPARISON:  CT of the abdomen and pelvis performed 06/29/2009  FINDINGS: The visualized lung bases are clear.  The liver and spleen are unremarkable in appearance. The gallbladder is within normal limits. The pancreas and adrenal glands are unremarkable.  The kidneys are unremarkable in appearance. There is no evidence of hydronephrosis. No renal or ureteral stones are seen. No perinephric stranding is appreciated.  No free fluid is identified. The small bowel is unremarkable in appearance. The stomach is within normal  limits. No acute vascular abnormalities are seen.  The appendix is normal in caliber and contains air, without evidence of appendicitis. The colon is unremarkable in appearance.  The bladder is mildly distended and grossly unremarkable. A small urachal remnant is incidentally seen. The prostate remains normal in size. No inguinal lymphadenopathy is seen.  No acute osseous abnormalities are identified. A chronic right-sided pars defect is noted at L5, without evidence of anterolisthesis.  IMPRESSION: 1. No acute abnormality seen within the abdomen or pelvis. 2. 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   Electronically Signed   By: Roanna Raider M.D.   On: 12/23/2014 22:08   Dg Abd Acute W/chest  12/23/2014   CLINICAL DATA:  Vomiting today.  Rectal bleeding.  EXAM: DG ABDOMEN ACUTE W/ 1V CHEST  COMPARISON:  None.  FINDINGS: There is mildly prominent mid abdominal small bowel with a few short air-fluid levels. There is stool and air throughout the colon. No biliary or urinary calculi are evident. There is no extraluminal air. The upright view of the chest is negative for significant cardiopulmonary abnormality.  IMPRESSION: Mildly prominent mid abdominal small bowel, more likely nonobstructive.   Electronically Signed   By: Ellery Plunk M.D.   On: 12/23/2014 19:18   I have personally reviewed and evaluated these images and lab results as part of my medical decision-making.   EKG Interpretation   Date/Time:  Saturday December 23 2014 18:11:24 EDT Ventricular Rate:  98 PR Interval:  151 QRS Duration: 93 QT Interval:  339 QTC Calculation: 433 R Axis:   62 Text Interpretation:  Sinus rhythm Consider left atrial enlargement ST  elev, probable normal early repol pattern Baseline wander in lead(s) V2 No  significant change since last tracing Confirmed by ZACKOWSKI  MD, SCOTT  [phone]) on 12/23/2014 7:30:24 PM      MDM   Final diagnoses:  Dental infection   Gastroenteritis  Chest pain, unspecified chest pain type  Essential hypertension     Patients  labs reviewed.  Radiological studies were viewed, interpreted and considered during the medical decision making and disposition process. I agree with radiologists reading.  Results were also discussed with patient. He was prescribed amoxil for the dental infection, hydrocodone, zofran for nausea and pepcid to add to his protonix.  He was advised to f/u with his dentist regarding the dental infection, pcp  if vomiting and diarrhea not improving over the next several days.  He was also given a referral to cardiology for further evaluation of the chest pain which appears chronic in nature with no evidence of acute coronary event today.  His heart score is 3, low risk for MACE within the next 6 weeks.   Doubt PE as source of chest pain as d dimer neg and pt is low risk per Wells criteria. Pt was still with complaint of dental pain at time of dc, felt other sx were improved.        Burgess Amor, PA-C 12/24/14 0217  Vanetta Mulders, MD 12/24/14 2337

## 2015-01-06 ENCOUNTER — Emergency Department (HOSPITAL_COMMUNITY)
Admission: EM | Admit: 2015-01-06 | Discharge: 2015-01-07 | Disposition: A | Discharge status: Home / Self Care | Payer: Medicaid Other | Attending: Emergency Medicine | Admitting: Emergency Medicine

## 2015-01-06 DIAGNOSIS — Z720 Tobacco use: Secondary | ICD-10-CM | POA: Insufficient documentation

## 2015-01-06 DIAGNOSIS — G43909 Migraine, unspecified, not intractable, without status migrainosus: Secondary | ICD-10-CM | POA: Insufficient documentation

## 2015-01-06 DIAGNOSIS — Z91040 Latex allergy status: Secondary | ICD-10-CM | POA: Insufficient documentation

## 2015-01-06 DIAGNOSIS — F419 Anxiety disorder, unspecified: Secondary | ICD-10-CM | POA: Diagnosis not present

## 2015-01-06 DIAGNOSIS — R1030 Lower abdominal pain, unspecified: Secondary | ICD-10-CM | POA: Insufficient documentation

## 2015-01-06 DIAGNOSIS — K4090 Unilateral inguinal hernia, without obstruction or gangrene, not specified as recurrent: Secondary | ICD-10-CM | POA: Insufficient documentation

## 2015-01-06 DIAGNOSIS — Z792 Long term (current) use of antibiotics: Secondary | ICD-10-CM | POA: Insufficient documentation

## 2015-01-06 DIAGNOSIS — J45909 Unspecified asthma, uncomplicated: Secondary | ICD-10-CM | POA: Diagnosis not present

## 2015-01-06 DIAGNOSIS — I10 Essential (primary) hypertension: Secondary | ICD-10-CM | POA: Diagnosis not present

## 2015-01-06 DIAGNOSIS — Z791 Long term (current) use of non-steroidal anti-inflammatories (NSAID): Secondary | ICD-10-CM | POA: Insufficient documentation

## 2015-01-06 DIAGNOSIS — F329 Major depressive disorder, single episode, unspecified: Secondary | ICD-10-CM | POA: Diagnosis not present

## 2015-01-06 DIAGNOSIS — L02411 Cutaneous abscess of right axilla: Secondary | ICD-10-CM | POA: Insufficient documentation

## 2015-01-06 DIAGNOSIS — Z9981 Dependence on supplemental oxygen: Secondary | ICD-10-CM | POA: Diagnosis not present

## 2015-01-06 DIAGNOSIS — G4730 Sleep apnea, unspecified: Secondary | ICD-10-CM | POA: Diagnosis not present

## 2015-01-06 DIAGNOSIS — Z79899 Other long term (current) drug therapy: Secondary | ICD-10-CM | POA: Diagnosis not present

## 2015-01-07 ENCOUNTER — Encounter (HOSPITAL_COMMUNITY): Payer: Self-pay | Admitting: *Deleted

## 2015-01-07 LAB — URINALYSIS, ROUTINE W REFLEX MICROSCOPIC
Bilirubin Urine: NEGATIVE
Glucose, UA: NEGATIVE mg/dL
Hgb urine dipstick: NEGATIVE
Ketones, ur: NEGATIVE mg/dL
Leukocytes, UA: NEGATIVE
Nitrite: NEGATIVE
Protein, ur: NEGATIVE mg/dL
Specific Gravity, Urine: 1.02 (ref 1.005–1.030)
Urobilinogen, UA: 2 mg/dL — ABNORMAL HIGH (ref 0.0–1.0)
pH: 6 (ref 5.0–8.0)

## 2015-01-07 MED ORDER — HYDROCODONE-ACETAMINOPHEN 5-325 MG PO TABS
2.0000 | ORAL_TABLET | Freq: Once | ORAL | Status: AC
  Administered 2015-01-07: 2 via ORAL
  Filled 2015-01-07: qty 2

## 2015-01-07 MED ORDER — HYDROCODONE-ACETAMINOPHEN 5-325 MG PO TABS: 1.0000 | ORAL_TABLET | ORAL | Status: DC | PRN

## 2015-01-07 MED ORDER — LIDOCAINE-EPINEPHRINE (PF) 2 %-1:200000 IJ SOLN
10.0000 mL | Freq: Once | INTRAMUSCULAR | Status: AC
  Administered 2015-01-07: 10 mL via INTRADERMAL
  Filled 2015-01-07: qty 20

## 2015-01-07 NOTE — ED Notes (Signed)
Pt c/o groin pain and lower back pain that started a few days ago, pt also requesting "knot" under right axilla area to be "checked out" denies any problems with urination,

## 2015-01-07 NOTE — ED Provider Notes (Signed)
TIME SEEN: 12:48 AM   CHIEF COMPLAINT: Left groin pain, "knot " in the right axilla  HPI:  Gary Higgins is a 34 y.o. male who presents to the Emergency Department complaining of groin pain onset last night. He denies any penile discharge, scrotal or testicular swelling, painful urination, fever, chills, abdominal pain, or n/v/d. He denies any similar past symptoms. He denies any recent injuries but reports that he was lifting something heavy last night before pain started. Has not seen any swelling. He denies any hx of kidney stones. No previous history of STDs. Is 6 active with one partner.  He also reports a "knot" under his right axilla arm onset 2-3 weeks ago. He denies any drainage. He reports a hx of abscess.    ROS: See HPI Constitutional: no fever  Eyes: no drainage  ENT: no runny nose   Cardiovascular:  no chest pain  Resp: no SOB  GI: no vomiting GU: no dysuria Integumentary: no rash  Allergy: no hives  Musculoskeletal: no leg swelling  Neurological: no slurred speech ROS otherwise negative  PAST MEDICAL HISTORY/PAST SURGICAL HISTORY:  Past Medical History  Diagnosis Date  . Migraine   . Hypertension   . Sleep apnea 2014    CPAP study- uses CPAP q night, but currently machine is malfunctioning   [facility]  . Depression   . Anxiety   . GERD (gastroesophageal reflux disease)   . Neuromuscular disorder (HCC)     CT compromise- L hand  . Asthma     occas. use of inhaler     MEDICATIONS:  Prior to Admission medications   Medication Sig Start Date End Date Taking? Authorizing Provider  ALPRAZolam Prudy Feeler(XANAX) 0.5 MG tablet Take 0.5 mg by mouth 2 (two) times daily.    Historical Provider, MD  amoxicillin (AMOXIL) 500 MG capsule Take 1 capsule (500 mg total) by mouth 3 (three) times daily. 12/23/14   Burgess AmorJulie Idol, PA-C  atomoxetine (STRATTERA) 80 MG capsule Take 80 mg by mouth daily.    Historical Provider, MD  famotidine (PEPCID) 20 MG tablet Take 1 tablet (20  mg total) by mouth 2 (two) times daily. 12/23/14   Burgess AmorJulie Idol, PA-C  HYDROcodone-acetaminophen (NORCO/VICODIN) 5-325 MG tablet Take 1 tablet by mouth every 4 (four) hours as needed. 12/23/14   Burgess AmorJulie Idol, PA-C  ibuprofen (ADVIL,MOTRIN) 800 MG tablet Take 1 tablet (800 mg total) by mouth 3 (three) times daily. 01/13/14   Margarita Grizzleanielle Ray, MD  omeprazole (PRILOSEC) 40 MG capsule Take 40 mg by mouth daily.    Historical Provider, MD  ondansetron (ZOFRAN) 4 MG tablet Take 1 tablet (4 mg total) by mouth every 6 (six) hours. 12/23/14   Burgess AmorJulie Idol, PA-C  propranolol (INDERAL) 80 MG tablet Take 80 mg by mouth every morning.    Historical Provider, MD  traMADol (ULTRAM) 50 MG tablet Take 50 mg by mouth every 6 (six) hours as needed for moderate pain or severe pain.     Historical Provider, MD  venlafaxine XR (EFFEXOR-XR) 150 MG 24 hr capsule Take 150 mg by mouth daily with breakfast.    Historical Provider, MD    ALLERGIES:  Allergies  Allergen Reactions  . Bee Venom Shortness Of Breath and Swelling  . Latex Swelling  . Prednisone Rash    SOCIAL HISTORY:  Social History  Substance Use Topics  . Smoking status: Current Every Day Smoker -- 0.50 packs/day  . Smokeless tobacco: Never Used  . Alcohol Use:  No    FAMILY HISTORY: No family history on file.  EXAM: BP 139/84 mmHg  Pulse 104  Temp(Src) 97.9 F (36.6 C) (Oral)  Resp 22  Ht 6' (1.829 m)  Wt 235 lb (106.595 kg)  BMI 31.86 kg/m2  SpO2 95% CONSTITUTIONAL: Alert and oriented and responds appropriately to questions. Well-appearing; well-nourished HEAD: Normocephalic EYES: Conjunctivae clear, PERRL ENT: normal nose; no rhinorrhea; moist mucous membranes; pharynx without lesions noted NECK: Supple, no meningismus, no LAD  CARD: RRR; S1 and S2 appreciated; no murmurs, no clicks, no rubs, no gallops RESP: Normal chest excursion without splinting or tachypnea; breath sounds clear and equal bilaterally; no wheezes, no rhonchi, no rales, no  hypoxia or respiratory distress, speaking full sentences ABD/GI: Normal bowel sounds; non-distended; soft, non-tender, no rebound, no guarding, no peritoneal signs, patient has a small left inguinal hernia only appreciated with Valsalva GU:  2+ symmetrical pulses bilaterally, normal external genitalia, circumcised male, no blood or discharge at the urethral meatus, no testicular tenderness, masses or swelling, no scrotal swelling, no perineal erythema, warmth, crepitus BACK:  The back appears normal and is non-tender to palpation, there is no CVA tenderness EXT: Patient has a 2 x 3 cm fluctuant area underneath the right axilla with a small amount of erythema and warmth with no surrounding signs of cellulitis, no axillary lymphadenopathy, no joint effusions, Normal ROM in all joints; non-tender to palpation; no edema; normal capillary refill; no cyanosis, no calf tenderness or swelling    SKIN: Normal color for age and race; warm NEURO: Moves all extremities equally, sensation to light touch intact diffusely, cranial nerves II through XII intact PSYCH: The patient's mood and manner are appropriate. Grooming and personal hygiene are appropriate.  MEDICAL DECISION MAKING: Patient here with a small inguinal hernia that is only reproducible with Valsalva. No sign of bowel obstruction or strangulated hernia.  Urine shows no blood or sign of infection. Doubt kidney stone. No sign of testicular torsion, epididymitis on exam. Will discharge with pain medication and surgery follow-up information as needed. Have advised him not to strain and I discussed with him return precautions. He also is a small abscess to the right axilla that have opened with minimal amount of purulent drainage. No sign of cellulitis on exam. I recommended outpatient follow-up with his PCP. Discussed supportive care instructions for his abscess. He verbalizes understanding and is comfortable with this plan.     INCISION AND  DRAINAGE Performed by: Raelyn Number Consent: Verbal consent obtained. Risks and benefits: risks, benefits and alternatives were discussed Type: abscess  Body area: Right axilla  Anesthesia: local infiltration  Incision was made with a scalpel.  Local anesthetic: lidocaine 2 % with epinephrine  Anesthetic total: 5 ml  Complexity: complex Blunt dissection to break up loculations  Drainage: purulent  Drainage amount: Small   Patient tolerance: Patient tolerated the procedure well with no immediate complications.      By signing my name below, I, Emmanuella Mensah, attest that this documentation has been prepared under the direction and in the presence of Harlym Gehling N Novia Lansberry, DO. Electronically Signed: Angelene Giovanni, ED Scribe. 01/07/2015. 12:55 AM.   Layla Maw Weiland Tomich, DO 01/07/15 0532

## 2015-01-07 NOTE — Discharge Instructions (Signed)
Hernia, Adult A hernia is the bulging of an organ or tissue through a weak spot in the muscles of the abdomen (abdominal wall). Hernias develop most often near the navel or groin. There are many kinds of hernias. Common kinds include:  Femoral hernia. This kind of hernia develops under the groin in the upper thigh area.  Inguinal hernia. This kind of hernia develops in the groin or scrotum.  Umbilical hernia. This kind of hernia develops near the navel.  Hiatal hernia. This kind of hernia causes part of the stomach to be pushed up into the chest.  Incisional hernia. This kind of hernia bulges through a scar from an abdominal surgery. CAUSES This condition may be caused by:  Heavy lifting.  Coughing over a long period of time.  Straining to have a bowel movement.  An incision made during an abdominal surgery.  A birth defect (congenital defect).  Excess weight or obesity.  Smoking.  Poor nutrition.  Cystic fibrosis.  Excess fluid in the abdomen.  Undescended testicles. SYMPTOMS Symptoms of a hernia include:  A lump on the abdomen. This is the first sign of a hernia. The lump may become more obvious with standing, straining, or coughing. It may get bigger over time if it is not treated or if the condition causing it is not treated.  Pain. A hernia is usually painless, but it may become painful over time if treatment is delayed. The pain is usually dull and may get worse with standing or lifting heavy objects. Sometimes a hernia gets tightly squeezed in the weak spot (strangulated) or stuck there (incarcerated) and causes additional symptoms. These symptoms may include:  Vomiting.  Nausea.  Constipation.  Irritability. DIAGNOSIS A hernia may be diagnosed with:  A physical exam. During the exam your health care provider may ask you to cough or to make a specific movement, because a hernia is usually more visible when you move.  Imaging tests. These can  include:  X-rays.  Ultrasound.  CT scan. TREATMENT A hernia that is small and painless may not need to be treated. A hernia that is large or painful may be treated with surgery. Inguinal hernias may be treated with surgery to prevent incarceration or strangulation. Strangulated hernias are always treated with surgery, because lack of blood to the trapped organ or tissue can cause it to die. Surgery to treat a hernia involves pushing the bulge back into place and repairing the weak part of the abdomen. HOME CARE INSTRUCTIONS  Avoid straining.  Do not lift anything heavier than 10 lb (4.5 kg).  Lift with your leg muscles, not your back muscles. This helps avoid strain.  When coughing, try to cough gently.  Prevent constipation. Constipation leads to straining with bowel movements, which can make a hernia worse or cause a hernia repair to break down. You can prevent constipation by:  Eating a high-fiber diet that includes plenty of fruits and vegetables.  Drinking enough fluids to keep your urine clear or pale yellow. Aim to drink 6-8 glasses of water per day.  Using a stool softener as directed by your health care provider.  Lose weight, if you are overweight.  Do not use any tobacco products, including cigarettes, chewing tobacco, or electronic cigarettes. If you need help quitting, ask your health care provider.  Keep all follow-up visits as directed by your health care provider. This is important. Your health care provider may need to monitor your condition. SEEK MEDICAL CARE IF:  You have  swelling, redness, and pain in the affected area.  Your bowel habits change. SEEK IMMEDIATE MEDICAL CARE IF:  You have a fever.  You have abdominal pain that is getting worse.  You feel nauseous or you vomit.  You cannot push the hernia back in place by gently pressing on it while you are lying down.  The hernia:  Changes in shape or size.  Is stuck outside the  abdomen.  Becomes discolored.  Feels hard or tender.   This information is not intended to replace advice given to you by your health care provider. Make sure you discuss any questions you have with your health care provider.   Document Released: 03/03/2005 Document Revised: 03/24/2014 Document Reviewed: 01/11/2014 Elsevier Interactive Patient Education 2016 Elsevier Inc.  Abscess An abscess is an infected area that contains a collection of pus and debris.It can occur in almost any part of the body. An abscess is also known as a furuncle or boil. CAUSES  An abscess occurs when tissue gets infected. This can occur from blockage of oil or sweat glands, infection of hair follicles, or a minor injury to the skin. As the body tries to fight the infection, pus collects in the area and creates pressure under the skin. This pressure causes pain. People with weakened immune systems have difficulty fighting infections and get certain abscesses more often.  SYMPTOMS Usually an abscess develops on the skin and becomes a painful mass that is red, warm, and tender. If the abscess forms under the skin, you may feel a moveable soft area under the skin. Some abscesses break open (rupture) on their own, but most will continue to get worse without care. The infection can spread deeper into the body and eventually into the bloodstream, causing you to feel ill.  DIAGNOSIS  Your caregiver will take your medical history and perform a physical exam. A sample of fluid may also be taken from the abscess to determine what is causing your infection. TREATMENT  Your caregiver may prescribe antibiotic medicines to fight the infection. However, taking antibiotics alone usually does not cure an abscess. Your caregiver may need to make a small cut (incision) in the abscess to drain the pus. In some cases, gauze is packed into the abscess to reduce pain and to continue draining the area. HOME CARE INSTRUCTIONS   Only take  over-the-counter or prescription medicines for pain, discomfort, or fever as directed by your caregiver.  If you were prescribed antibiotics, take them as directed. Finish them even if you start to feel better.  If gauze is used, follow your caregiver's directions for changing the gauze.  To avoid spreading the infection:  Keep your draining abscess covered with a bandage.  Wash your hands well.  Do not share personal care items, towels, or whirlpools with others.  Avoid skin contact with others.  Keep your skin and clothes clean around the abscess.  Keep all follow-up appointments as directed by your caregiver. SEEK MEDICAL CARE IF:   You have increased pain, swelling, redness, fluid drainage, or bleeding.  You have muscle aches, chills, or a general ill feeling.  You have a fever. MAKE SURE YOU:   Understand these instructions.  Will watch your condition.  Will get help right away if you are not doing well or get worse.   This information is not intended to replace advice given to you by your health care provider. Make sure you discuss any questions you have with your health care provider.  Document Released: 12/11/2004 Document Revised: 09/02/2011 Document Reviewed: 05/16/2011 Elsevier Interactive Patient Education [employer]2016 Elsevier Inc.

## 2015-01-19 ENCOUNTER — Other Ambulatory Visit: Payer: Self-pay | Admitting: Physical Medicine and Rehabilitation

## 2015-01-19 DIAGNOSIS — M542 Cervicalgia: Secondary | ICD-10-CM

## 2015-02-03 ENCOUNTER — Inpatient Hospital Stay
Admission: RE | Admit: 2015-02-03 | Discharge: 2015-02-03 | Disposition: A | Discharge status: Home / Self Care | Payer: Medicaid Other | Source: Ambulatory Visit | Attending: Physical Medicine and Rehabilitation | Admitting: Physical Medicine and Rehabilitation

## 2015-02-17 ENCOUNTER — Other Ambulatory Visit: Payer: Medicaid Other

## 2016-08-10 ENCOUNTER — Encounter (HOSPITAL_COMMUNITY): Payer: Self-pay

## 2016-08-10 ENCOUNTER — Emergency Department (HOSPITAL_COMMUNITY): Payer: Medicaid Other

## 2016-08-10 ENCOUNTER — Observation Stay (HOSPITAL_COMMUNITY)
Admission: EM | Admit: 2016-08-10 | Discharge: 2016-08-11 | Disposition: A | Discharge status: Home / Self Care | Payer: Medicaid Other | Attending: Internal Medicine | Admitting: Internal Medicine

## 2016-08-10 DIAGNOSIS — R079 Chest pain, unspecified: Secondary | ICD-10-CM | POA: Diagnosis not present

## 2016-08-10 DIAGNOSIS — I10 Essential (primary) hypertension: Secondary | ICD-10-CM | POA: Diagnosis not present

## 2016-08-10 DIAGNOSIS — F17200 Nicotine dependence, unspecified, uncomplicated: Secondary | ICD-10-CM | POA: Insufficient documentation

## 2016-08-10 DIAGNOSIS — R0789 Other chest pain: Principal | ICD-10-CM | POA: Insufficient documentation

## 2016-08-10 DIAGNOSIS — F1290 Cannabis use, unspecified, uncomplicated: Secondary | ICD-10-CM | POA: Insufficient documentation

## 2016-08-10 DIAGNOSIS — G4733 Obstructive sleep apnea (adult) (pediatric): Secondary | ICD-10-CM | POA: Diagnosis present

## 2016-08-10 DIAGNOSIS — J45909 Unspecified asthma, uncomplicated: Secondary | ICD-10-CM | POA: Insufficient documentation

## 2016-08-10 DIAGNOSIS — Z79899 Other long term (current) drug therapy: Secondary | ICD-10-CM | POA: Insufficient documentation

## 2016-08-10 DIAGNOSIS — E669 Obesity, unspecified: Secondary | ICD-10-CM | POA: Diagnosis present

## 2016-08-10 DIAGNOSIS — Z720 Tobacco use: Secondary | ICD-10-CM | POA: Diagnosis present

## 2016-08-10 DIAGNOSIS — Z9889 Other specified postprocedural states: Secondary | ICD-10-CM

## 2016-08-10 DIAGNOSIS — F319 Bipolar disorder, unspecified: Secondary | ICD-10-CM | POA: Diagnosis present

## 2016-08-10 DIAGNOSIS — F909 Attention-deficit hyperactivity disorder, unspecified type: Secondary | ICD-10-CM | POA: Diagnosis present

## 2016-08-10 LAB — BASIC METABOLIC PANEL
Anion gap: 12 (ref 5–15)
BUN: <5 mg/dL — ABNORMAL LOW (ref 6–20)
CO2: 25 mmol/L (ref 22–32)
Calcium: 9.2 mg/dL (ref 8.9–10.3)
Chloride: 104 mmol/L (ref 101–111)
Creatinine, Ser: 0.72 mg/dL (ref 0.61–1.24)
GFR calc Af Amer: >60 mL/min (ref >60)
GFR calc non Af Amer: >60 mL/min (ref >60)
Glucose, Bld: 115 mg/dL — ABNORMAL HIGH (ref 65–99)
Potassium: 3.3 mmol/L — ABNORMAL LOW (ref 3.5–5.1)
Sodium: 141 mmol/L (ref 135–145)

## 2016-08-10 LAB — I-STAT TROPONIN, ED
Troponin i, poc: 0 ng/mL (ref 0.00–0.08)
Troponin i, poc: 0 ng/mL (ref 0.00–0.08)

## 2016-08-10 LAB — CBC
HCT: 43 % (ref 39.0–52.0)
Hemoglobin: 15.3 g/dL (ref 13.0–17.0)
MCH: 30.6 pg (ref 26.0–34.0)
MCHC: 35.6 g/dL (ref 30.0–36.0)
MCV: 86 fL (ref 78.0–100.0)
Platelets: 209 10*3/uL (ref 150–400)
RBC: 5 MIL/uL (ref 4.22–5.81)
RDW: 12.3 % (ref 11.5–15.5)
WBC: 9.4 10*3/uL (ref 4.0–10.5)

## 2016-08-10 LAB — TROPONIN I: Troponin I: <0.03 ng/mL (ref <0.03)

## 2016-08-10 MED ORDER — ASPIRIN 81 MG PO CHEW
CHEWABLE_TABLET | ORAL | Status: AC
  Filled 2016-08-10: qty 4

## 2016-08-10 MED ORDER — CEPHALEXIN 500 MG PO CAPS
500.0000 mg | ORAL_CAPSULE | Freq: Four times a day (QID) | ORAL | Status: DC
  Administered 2016-08-10 – 2016-08-11 (×3): 500 mg via ORAL
  Filled 2016-08-10 (×3): qty 1

## 2016-08-10 MED ORDER — NITROGLYCERIN 0.4 MG SL SUBL: 0.4000 mg | SUBLINGUAL_TABLET | SUBLINGUAL | Status: DC | PRN

## 2016-08-10 MED ORDER — ATOMOXETINE HCL 80 MG PO CAPS: 80.0000 mg | ORAL_CAPSULE | Freq: Every day | ORAL | Status: DC

## 2016-08-10 MED ORDER — POTASSIUM CHLORIDE CRYS ER 20 MEQ PO TBCR
40.0000 meq | EXTENDED_RELEASE_TABLET | ORAL | Status: AC
  Administered 2016-08-10 – 2016-08-11 (×2): 40 meq via ORAL
  Filled 2016-08-10 (×2): qty 2

## 2016-08-10 MED ORDER — ASPIRIN 81 MG PO CHEW
81.0000 mg | CHEWABLE_TABLET | Freq: Every day | ORAL | Status: DC
  Administered 2016-08-11: 81 mg via ORAL
  Filled 2016-08-10: qty 1

## 2016-08-10 MED ORDER — CARVEDILOL 12.5 MG PO TABS
12.5000 mg | ORAL_TABLET | Freq: Two times a day (BID) | ORAL | Status: DC
  Administered 2016-08-10 – 2016-08-11 (×2): 12.5 mg via ORAL
  Filled 2016-08-10 (×2): qty 1

## 2016-08-10 MED ORDER — ATOMOXETINE HCL 40 MG PO CAPS
80.0000 mg | ORAL_CAPSULE | Freq: Every day | ORAL | Status: DC
  Administered 2016-08-11: 80 mg via ORAL
  Filled 2016-08-10 (×3): qty 2

## 2016-08-10 MED ORDER — ATORVASTATIN CALCIUM 40 MG PO TABS
40.0000 mg | ORAL_TABLET | Freq: Every day | ORAL | Status: DC
  Administered 2016-08-10: 40 mg via ORAL
  Filled 2016-08-10: qty 1

## 2016-08-10 MED ORDER — ONDANSETRON HCL 4 MG/2ML IJ SOLN: 4.0000 mg | Freq: Four times a day (QID) | INTRAMUSCULAR | Status: DC | PRN

## 2016-08-10 MED ORDER — DIVALPROEX SODIUM 250 MG PO DR TAB
500.0000 mg | DELAYED_RELEASE_TABLET | Freq: Two times a day (BID) | ORAL | Status: DC
  Administered 2016-08-10 – 2016-08-11 (×2): 500 mg via ORAL
  Filled 2016-08-10 (×2): qty 2

## 2016-08-10 MED ORDER — ASPIRIN 325 MG PO TABS
ORAL_TABLET | ORAL | Status: AC
  Filled 2016-08-10: qty 1

## 2016-08-10 MED ORDER — ASPIRIN 325 MG PO TABS
325.0000 mg | ORAL_TABLET | Freq: Once | ORAL | Status: AC
  Administered 2016-08-10: 325 mg via ORAL

## 2016-08-10 MED ORDER — ACETAMINOPHEN 325 MG PO TABS
650.0000 mg | ORAL_TABLET | ORAL | Status: DC | PRN
  Administered 2016-08-11 (×3): 650 mg via ORAL
  Filled 2016-08-10 (×3): qty 2

## 2016-08-10 MED ORDER — ENOXAPARIN SODIUM 40 MG/0.4ML ~~LOC~~ SOLN
40.0000 mg | SUBCUTANEOUS | Status: DC
  Administered 2016-08-10: 40 mg via SUBCUTANEOUS
  Filled 2016-08-10: qty 0.4

## 2016-08-10 MED ORDER — VENLAFAXINE HCL ER 75 MG PO CP24
150.0000 mg | ORAL_CAPSULE | Freq: Every day | ORAL | Status: DC
  Administered 2016-08-11: 150 mg via ORAL
  Filled 2016-08-10: qty 2

## 2016-08-10 NOTE — ED Provider Notes (Signed)
[facility]-EMERGENCY DEPT Provider Note   CSN: [id number] Arrival date & time: 08/10/16  1439     History   Chief Complaint Chief Complaint  Patient presents with  . Chest Pain    HPI Gary Higgins is a 36 y.o. male.  Chest pain described as an elephant sitting on my chest at approximately 11 AM today with associated diaphoresis, nausea, no dyspnea. No previous cardiac history. EMS was called and patient was given nitroglycerin and aspirin which seemed to help. Critical risk factors include hypertension, hypercholesterolemia, smoking.  Family history: Cousin had MI at age 27. Patient is feeling much better at this time. Symptoms originated while patient was resting.      Past Medical History:  Diagnosis Date  . Anxiety   . Asthma    occas. use of inhaler   . Depression   . GERD (gastroesophageal reflux disease)   . Hypertension   . Migraine   . Neuromuscular disorder (HCC)    CT compromise- L hand  . Sleep apnea 2014   CPAP study- uses CPAP q night, but currently machine is malfunctioning   - done at [facility]    Patient Active Problem List   Diagnosis Date Noted  . HNP (herniated nucleus pulposus), cervical 05/17/2014    Past Surgical History:  Procedure Laterality Date  . ANTERIOR CERVICAL DECOMP/DISCECTOMY FUSION N/A 05/17/2014   Procedure: Cervical five-six anterior cervical decompression with fusion plating and bonegraft;  Surgeon: Coletta Memos, MD;  Location: [facility];  Service: Neurosurgery;  Laterality: N/A;  Cervical five-six anterior cervical decompression with fusion plating and bonegraft  . CARPAL TUNNEL RELEASE Right   . SCROTAL SURGERY     as a child- related to trauma  . WISDOM TOOTH EXTRACTION         Home Medications    Prior to Admission medications   Medication Sig Start Date End Date Taking? Authorizing Provider  ALPRAZolam Prudy Feeler) 0.5 MG tablet Take 0.5 mg by mouth 2 (two) times daily.   Yes [provider]  atomoxetine  (STRATTERA) 80 MG capsule Take 80 mg by mouth daily.   Yes [provider]  cephALEXin (KEFLEX) 500 MG capsule Take 500 mg by mouth 4 (four) times daily. 10 day course starting on 08/10/2016   Yes [provider]  divalproex (DEPAKOTE) 500 MG DR tablet Take 500 mg by mouth 2 (two) times daily.   Yes [provider]  ibuprofen (ADVIL,MOTRIN) 800 MG tablet Take 1 tablet (800 mg total) by mouth 3 (three) times daily. 01/13/14  Yes Margarita Grizzle, MD  propranolol ER (INDERAL LA) 120 MG 24 hr capsule Take 120 mg by mouth every morning.   Yes [provider]  traMADol (ULTRAM) 50 MG tablet Take 50 mg by mouth every 6 (six) hours as needed for moderate pain or severe pain.    Yes [provider]  venlafaxine XR (EFFEXOR-XR) 150 MG 24 hr capsule Take 150 mg by mouth daily with breakfast.   Yes [provider]    Family History No family history on file.  Social History Social History  Substance Use Topics  . Smoking status: Current Every Day Smoker    Packs/day: 0.50  . Smokeless tobacco: Never Used  . Alcohol use No     Allergies   Bee venom; Latex; and Prednisone   Review of Systems Review of Systems  All other systems reviewed and are negative.    Physical Exam Updated Vital Signs BP 111/76  Pulse 82   Temp 98.1 F (36.7 C) (Oral)   Resp (!) 21   Ht 6' (1.829 m)   Wt 106.6 kg (235 lb)   SpO2 96%   BMI 31.87 kg/m   Physical Exam  Constitutional: He is oriented to person, place, and time. He appears well-developed and well-nourished.  HENT:  Head: Normocephalic and atraumatic.  Eyes: Conjunctivae are normal.  Neck: Neck supple.  Cardiovascular: Normal rate and regular rhythm.   Pulmonary/Chest: Effort normal and breath sounds normal.  Abdominal: Soft. Bowel sounds are normal.  Musculoskeletal: Normal range of motion.  Neurological: He is alert and oriented to person, place, and time.  Skin: Skin is warm and dry.    Psychiatric: He has a normal mood and affect. His behavior is normal.  Nursing note and vitals reviewed.    ED Treatments / Results  Labs (all labs ordered are listed, but only abnormal results are displayed) Labs Reviewed  BASIC METABOLIC PANEL - Abnormal; Notable for the following:       Result Value   Potassium 3.3 (*)    Glucose, Bld 115 (*)    BUN <5 (*)    All other components within normal limits  CBC  I-STAT TROPOININ, ED    EKG  EKG Interpretation  Date/Time:  Sunday Aug 10 2016 14:41:42 EDT Ventricular Rate:  100 PR Interval:    QRS Duration: 87 QT Interval:  357 QTC Calculation: 461 R Axis:   64 Text Interpretation:  Sinus tachycardia Probable left atrial enlargement Confirmed by Dayvian Blixt  MD, Fuquan Wilson ([id number]) on 08/10/2016 3:15:05 PM       Radiology Dg Chest 2 View  Result Date: 08/10/2016 CLINICAL DATA:  35 year old male with history of chest pain and shortness of breath when taking deep breath. EXAM: CHEST  2 VIEW COMPARISON:  No priors. FINDINGS: Mild diffuse peribronchial cuffing. Lung volumes are normal. No consolidative airspace disease. No pleural effusions. No pneumothorax. No pulmonary nodule or mass noted. Pulmonary vasculature and the cardiomediastinal silhouette are within normal limits. Atherosclerosis in the thoracic aorta. Orthopedic fixation hardware in the lower cervical spine incidentally noted. IMPRESSION: 1. Mild diffuse peribronchial cuffing, concerning for an acute bronchitis. 2. Aortic atherosclerosis. Electronically Signed   By: Daniel  Entrikin M.D.   On: 08/10/2016 15:32    Procedures Procedures (including critical care time)  Medications Ordered in ED Medications - No data to display   Initial Impression / Assessment and Plan / ED Course  I have reviewed the triage vital signs and the nursing notes.  Pertinent labs & imaging results that were available during my care of the patient were reviewed by me and considered in my medical  decision making (see chart for details).     18 40:  Discussed with Dr. Mayford Knifeurner, cardiologist on-call. She recommended admission for observation and echocardiogram in the morning.  Final Clinical Impressions(s) / ED Diagnoses   Final diagnoses:  Chest pain, unspecified type    New Prescriptions New Prescriptions   No medications on file     Donnetta Hutchingook, Nasirah Sachs, MD 08/10/16 1842

## 2016-08-10 NOTE — H&P (Signed)
History and Physical    Gary Higgins:[id number] DOB: 1980-07-30 DOA: 08/10/2016  PCP: Lianne Moris, PA-C   Patient coming from: Home   Chief Complaint: Chest pain.  HPI: Gary Higgins is a 36 y.o. male with medical history significant of  Hypertension, tobacco abuse, ADHD, bipolar disorder, asthma, obstructive sleep apnea, history of cervical discectomy. Patient presented from home today via EMS, with complaints of chest pain that started today while he was sitting on the couch watching TV. Pain 10/10, radiating to left upper extremity, associated with vomiting, diaphoresis and shortness of breath. Chest pain worsened by deep breathing in the ED. Mother and spouse with present at the time of cough and this. Mother called EMS. Nitroglycerin and aspirin given by EMS.  Patient smokes 1 pack of cigarettes daily. No premature family history of coronary artery disease- but history of MI in cousin- age 78, grandfather multiple stents- ?age. Denies illicit drug use, but uses marijuana,   No previous episodes, no cough, no shortness of breath prior to this, no fever or chills, no leg swelling and redness, no personal or family history of blood clots. As at the tme of my exam patient Was Chest Pain-Free.  ED Course: Vitals stable, I-stat troponin 3 negative, BMP- k- 3.3, EKG was nonacute, unchanged from prior. Cardiology was called by ED provider- recommendations to admit the patient and get echo done Hospitalist was called to admit for chest pain workup.   Review of Systems: Patient notes some loose stool yesterday , without abdominal pain or vomiting prior to this, As per HPI otherwise 10 point review of systems negative.   Past Medical History:  Diagnosis Date  . Anxiety   . Asthma    occas. use of inhaler   . Depression   . GERD (gastroesophageal reflux disease)   . Hypertension   . Migraine   . Neuromuscular disorder (HCC)    CT compromise- L hand  . Sleep apnea 2014   CPAP  study- uses CPAP q night, but currently machine is malfunctioning   - done at [facility]    Past Surgical History:  Procedure Laterality Date  . ANTERIOR CERVICAL DECOMP/DISCECTOMY FUSION N/A 05/17/2014   Procedure: Cervical five-six anterior cervical decompression with fusion plating and bonegraft;  Surgeon: Coletta Memos, MD;  Location: [facility];  Service: Neurosurgery;  Laterality: N/A;  Cervical five-six anterior cervical decompression with fusion plating and bonegraft  . CARPAL TUNNEL RELEASE Right   . SCROTAL SURGERY     as a child- related to trauma  . WISDOM TOOTH EXTRACTION      reports that he has been smoking.  He has been smoking about 0.50 packs per day. He has never used smokeless tobacco. He reports that he uses drugs, including Marijuana. He reports that he does not drink alcohol.  Allergies  Allergen Reactions  . Bee Venom Shortness Of Breath and Swelling  . Latex Swelling  . Prednisone Rash    Family History  Problem Relation Age of Onset  . CAD Maternal Grandfather    Unacceptable: Noncontributory, unremarkable, or negative. Acceptable: Family history reviewed and not pertinent (If you reviewed it)  Prior to Admission medications   Medication Sig Start Date End Date Taking? Authorizing Provider  ALPRAZolam Prudy Feeler) 0.5 MG tablet Take 0.5 mg by mouth 2 (two) times daily.   Yes [provider]  atomoxetine (STRATTERA) 80 MG capsule Take 80 mg by mouth daily.   Yes [provider]  cephALEXin (KEFLEX)  500 MG capsule Take 500 mg by mouth 4 (four) times daily. 10 day course starting on 08/10/2016   Yes [provider]  divalproex (DEPAKOTE) 500 MG DR tablet Take 500 mg by mouth 2 (two) times daily.   Yes [provider]  ibuprofen (ADVIL,MOTRIN) 800 MG tablet Take 1 tablet (800 mg total) by mouth 3 (three) times daily. 01/13/14  Yes Margarita Grizzle, MD  propranolol ER (INDERAL LA) 120 MG 24 hr capsule Take 120 mg by mouth every morning.    Yes [provider]  traMADol (ULTRAM) 50 MG tablet Take 50 mg by mouth every 6 (six) hours as needed for moderate pain or severe pain.    Yes [provider]  venlafaxine XR (EFFEXOR-XR) 150 MG 24 hr capsule Take 150 mg by mouth daily with breakfast.   Yes [provider]    Physical Exam: Vitals:   08/10/16 1730 08/10/16 1745 08/10/16 1800 08/10/16 2000  BP: 133/83  131/86 125/78  Pulse: 73 72 82 68  Resp: 19 19 19 20   Temp:      TempSrc:      SpO2: 95% 97% 94% 98%  Weight:      Height:        Constitutional: NAD, calm, comfortable Vitals:   08/10/16 1730 08/10/16 1745 08/10/16 1800 08/10/16 2000  BP: 133/83  131/86 125/78  Pulse: 73 72 82 68  Resp: 19 19 19 20   Temp:      TempSrc:      SpO2: 95% 97% 94% 98%  Weight:      Height:       Eyes: PERRL, lids and conjunctivae normal ENMT: Mucous membranes are dry. Posterior pharynx clear of any exudate or lesions.Normal dentition.  Neck: normal, supple, no masses, no thyromegaly Respiratory: clear to auscultation bilaterally, no wheezing, no crackles. Normal respiratory effort. No accessory muscle use.  Cardiovascular: Regular rate and rhythm, no murmurs / rubs / gallops. No extremity edema. 2+ pedal pulses. Chest pain provoked by deep breathing on exam Abdomen: no tenderness, no masses palpated. No hepatosplenomegaly. Bowel sounds positive.  Musculoskeletal: no clubbing / cyanosis. No joint deformity upper and lower extremities. Good ROM, no contractures. Normal muscle tone.  Skin: no rashes, lesions, ulcers. No induration Neurologic: CN 2-12 grossly intact. Sensation intact, DTR normal. Strength 5/5 in all 4.  Psychiatric: Normal judgment and insight. Alert and oriented x 3. Normal mood.   Labs on Admission: I have personally reviewed following labs and imaging studies  CBC:  Recent Labs Lab 08/10/16 1457  WBC 9.4  HGB 15.3  HCT 43.0  MCV 86.0  PLT 209   Basic Metabolic Panel:  Recent  Labs Lab 08/10/16 1457  NA 141  K 3.3*  CL 104  CO2 25  GLUCOSE 115*  BUN <5*  CREATININE 0.72  CALCIUM 9.2    Radiological Exams on Admission: Dg Chest 2 View  Result Date: 08/10/2016 CLINICAL DATA:  36 year old male with history of chest pain and shortness of breath when taking deep breath. EXAM: CHEST  2 VIEW COMPARISON:  No priors. FINDINGS: Mild diffuse peribronchial cuffing. Lung volumes are normal. No consolidative airspace disease. No pleural effusions. No pneumothorax. No pulmonary nodule or mass noted. Pulmonary vasculature and the cardiomediastinal silhouette are within normal limits. Atherosclerosis in the thoracic aorta. Orthopedic fixation hardware in the lower cervical spine incidentally noted. IMPRESSION: 1. Mild diffuse peribronchial cuffing, concerning for an acute bronchitis. 2. Aortic atherosclerosis. Electronically Signed   By: Reuel Boom  Entrikin M.D.   On: 08/10/2016 15:32    EKG: Independently reviewed. Insignificant Q waves to inferior leads - 2, 3, aVF, lateral leads V5 V6. No ST or T-wave abnormalities . unchanged from prior.  Assessment/Plan Principal Problem:   Chest pain Active Problems:   Obesity   Bipolar disorder (HCC)   H/O cervical discectomy   HTN (hypertension)   Tobacco abuse   ADHD   Asthma   OSA (obstructive sleep apnea)  Chest pain- hx concerning for cardiac etiology. Risk factors- hypertension, obesity, tobacco use. No family history of premature coronary artery disease- but cousin who is morbidly obese - had MI at age 36. HEART score- 4. I-STAT troponin 3 negative, EKG nonacute unchanged from prior. Chest x-ray showed acute bronchitis- but no fever, cough or shortness of breath. - Admit to Obs telemetry - Troponins 3 - Echo a.m. - NPO midnight - Cardiology consult in a.m., for possible stress test, other placed - UDS - Lipid panel a.m. - Aspirin 325 mg, continue 81mg  daily - Start Coreg - Lipitor 40 daily - SL Nitroglycerin when  necessary  Hypertension- BP stable here. Home medications- propranolol 120 mg daily. - Switch propranolol to Coreg 12.5mg  BID, for known cardiac protective effect   BPD- mood appears stable . home medications include Depakote and venlafaxine. - Continue while inpatient  OSA- not compliant with CPAP at home - CPAP inpatient  ADHD- patient on atomoxetine. Known to cause hypertension, associated with cardiovascular events in patients with cardiac disease. - Continue home atomoxetine, for now. - Depending on workup, will need to follow up with PCP to determine if patient needs this medication.  Tooth ache-with swelling, [facility]. Was prescribed Keflex. - Continue Keflex for inpatient.  Hypokalemia- 3.3. Possibly related to 3 episodes of diarrhea yesterday, none today. - Replete - BMP am  Tobacco abuse  DVT prophylaxis: Lovenox Code Status: Full Family Communication: Wife and mother at bedside Disposition Plan: Home Consults called: Cardiology Admission status: obs, tele  Onnie BoerEjiroghene E Iram Astorino MD [facility] Pager [phone]  If 11pm-7AM, please contact night-coverage [email or web address] Password [email or web address]  08/10/2016, 8:25 PM

## 2016-08-10 NOTE — ED Triage Notes (Signed)
Pt. Complaining of chest pain. Was given 1 nitro and 4 aspirin (324 mg) on way to hospital. Pt. Describes pain as pressure on left side of chest going into left shoulder. Feels like "elephant is sitting on chest." Has been hypertensive. No history of heart problems

## 2016-08-11 ENCOUNTER — Observation Stay (HOSPITAL_BASED_OUTPATIENT_CLINIC_OR_DEPARTMENT_OTHER): Payer: Medicaid Other

## 2016-08-11 DIAGNOSIS — R079 Chest pain, unspecified: Secondary | ICD-10-CM

## 2016-08-11 DIAGNOSIS — G4733 Obstructive sleep apnea (adult) (pediatric): Secondary | ICD-10-CM

## 2016-08-11 DIAGNOSIS — Z720 Tobacco use: Secondary | ICD-10-CM | POA: Diagnosis not present

## 2016-08-11 DIAGNOSIS — I10 Essential (primary) hypertension: Secondary | ICD-10-CM

## 2016-08-11 DIAGNOSIS — R072 Precordial pain: Secondary | ICD-10-CM

## 2016-08-11 DIAGNOSIS — F909 Attention-deficit hyperactivity disorder, unspecified type: Secondary | ICD-10-CM | POA: Diagnosis not present

## 2016-08-11 LAB — RAPID URINE DRUG SCREEN, HOSP PERFORMED
Amphetamines: NOT DETECTED
Barbiturates: NOT DETECTED
Benzodiazepines: POSITIVE — AB
Cocaine: NOT DETECTED
Opiates: NOT DETECTED
Tetrahydrocannabinol: POSITIVE — AB

## 2016-08-11 LAB — BASIC METABOLIC PANEL
Anion gap: 10 (ref 5–15)
BUN: 7 mg/dL (ref 6–20)
CO2: 28 mmol/L (ref 22–32)
Calcium: 9.1 mg/dL (ref 8.9–10.3)
Chloride: 101 mmol/L (ref 101–111)
Creatinine, Ser: 0.7 mg/dL (ref 0.61–1.24)
GFR calc Af Amer: >60 mL/min (ref >60)
GFR calc non Af Amer: >60 mL/min (ref >60)
Glucose, Bld: 105 mg/dL — ABNORMAL HIGH (ref 65–99)
Potassium: 3.6 mmol/L (ref 3.5–5.1)
Sodium: 139 mmol/L (ref 135–145)

## 2016-08-11 LAB — TROPONIN I
Troponin I: <0.03 ng/mL (ref <0.03)
Troponin I: <0.03 ng/mL (ref <0.03)

## 2016-08-11 LAB — LIPID PANEL
Cholesterol: 190 mg/dL (ref 0–200)
HDL: 22 mg/dL — ABNORMAL LOW (ref >40)
LDL Cholesterol: UNDETERMINED mg/dL (ref 0–99)
Total CHOL/HDL Ratio: 8.6 RATIO
Triglycerides: 670 mg/dL — ABNORMAL HIGH (ref <150)
VLDL: UNDETERMINED mg/dL (ref 0–40)

## 2016-08-11 LAB — HIV ANTIBODY (ROUTINE TESTING W REFLEX): HIV Screen 4th Generation wRfx: NONREACTIVE

## 2016-08-11 LAB — ECHOCARDIOGRAM COMPLETE
Height: 72 in
Weight: 3772.8 oz

## 2016-08-11 LAB — D-DIMER, QUANTITATIVE: D-Dimer, Quant: <0.27 ug/mL-FEU (ref 0.00–0.50)

## 2016-08-11 MED ORDER — HYDRALAZINE HCL 20 MG/ML IJ SOLN
INTRAMUSCULAR | Status: AC
  Filled 2016-08-11: qty 1

## 2016-08-11 MED ORDER — IBUPROFEN 400 MG PO TABS: 400.0000 mg | ORAL_TABLET | Freq: Three times a day (TID) | ORAL | 0 refills | Status: DC | PRN

## 2016-08-11 MED ORDER — MORPHINE SULFATE (PF) 4 MG/ML IV SOLN
4.0000 mg | Freq: Once | INTRAVENOUS | Status: AC
  Administered 2016-08-11: 4 mg via INTRAVENOUS
  Filled 2016-08-11: qty 1

## 2016-08-11 MED ORDER — PANTOPRAZOLE SODIUM 40 MG PO TBEC
40.0000 mg | DELAYED_RELEASE_TABLET | Freq: Every day | ORAL | Status: DC
  Administered 2016-08-11: 40 mg via ORAL
  Filled 2016-08-11: qty 1

## 2016-08-11 MED ORDER — HYDRALAZINE HCL 20 MG/ML IJ SOLN
10.0000 mg | INTRAMUSCULAR | Status: DC | PRN
  Administered 2016-08-11: 10 mg via INTRAVENOUS

## 2016-08-11 MED ORDER — PANTOPRAZOLE SODIUM 40 MG PO TBEC: 40.0000 mg | DELAYED_RELEASE_TABLET | Freq: Every day | ORAL | 0 refills | Status: DC

## 2016-08-11 MED ORDER — ONDANSETRON HCL 4 MG/2ML IJ SOLN: 4.0000 mg | INTRAMUSCULAR | Status: DC | PRN

## 2016-08-11 MED ORDER — IBUPROFEN 400 MG PO TABS
400.0000 mg | ORAL_TABLET | Freq: Three times a day (TID) | ORAL | Status: DC
  Administered 2016-08-11 (×2): 400 mg via ORAL
  Filled 2016-08-11 (×2): qty 1

## 2016-08-11 NOTE — Progress Notes (Signed)
Patient states understanding of discharge instructions.  

## 2016-08-11 NOTE — Progress Notes (Signed)
  Echocardiogram 2D Echocardiogram has been performed.  Tatisha Cerino L Androw 08/11/2016, 12:33 PM

## 2016-08-11 NOTE — Discharge Summary (Signed)
Physician Discharge Summary  Gary Higgins ZOX:[id number] DOB: Oct 10, 1980 DOA: 08/10/2016  PCP: Lianne Moris, PA-C  Admit date: 08/10/2016 Discharge date: 08/11/2016  Admitted From: Home  Disposition:  Home   Recommendations for Outpatient Follow-up:  1. Follow up with PCP in 1- weeks 2. Patient placed on pantoprazole 3. Ibuprofen dose decreased to 400 mg as needed q 8 hours  Home Health: No  Equipment/Devices: No   Discharge Condition: Stable  CODE STATUS: full  Diet recommendation: Heart Healthy   Brief/Interim Summary: 36 yo male who presented with the chief complain of chest pain. Patient known to have HTN, tobacco abuse, ADHD, bipolar, asthma and OSA. 24 hours of chest pain, occurred at rest, 10/10 in intensity, radiating to the left upper extrmity, associated with vomiting, diaphoresis and dyspnea. On the initial physical examination with blood pressure 133/83, HR 73, RR19 and oxygen saturation 95%. Oral mucosa moist, lungs clear to auscultation, heart with rhythmic S1 and S2, no gallops or rubs, abdomen soft, lower extremity edema. Pain reproducible with deep inspiration. EKG with sinus rhythm with no st elevation or depression, no significant t wave changes. Negative troponin. Chest x-ray negative for infiltrates, UDS positive for benzodiazepines and tetrahydrocannabinol. Sodium 139, potassium 3.6, chloride 101, bicarbonate 28, glucose 105, BUN 7, creatinine 0.70.   Patient admitted to rule out acute coronary syndrome.   1. Atypical chest pain ruled out for acute coronary syndrome. Patient had serial cardiac enzymes, EKGs were negative for acute coronary syndrome, his pain was more pleuritic and reproducible. Likely musculoskeletal. Continue ibuprofen 400 units every 8 hours as needed. D-dimer was negative. Echocardiogram was normal.  2. Hypertension. Continue propanolol.  3. Tobacco abuse. Smoking cessation counseling  4. OSA.Outpatient follow-up  5. ADHD and bipolar.  Continue alprazolam, Depakote, venlafaxine.   Discharge Diagnoses:  Principal Problem:   Chest pain Active Problems:   Obesity   Bipolar disorder (HCC)   H/O cervical discectomy   HTN (hypertension)   Tobacco abuse   ADHD   Asthma   OSA (obstructive sleep apnea)    Discharge Instructions   Allergies as of 08/11/2016      Reactions   Bee Venom Shortness Of Breath, Swelling   Latex Swelling   Prednisone Rash      Medication List    TAKE these medications   ALPRAZolam 0.5 MG tablet Commonly known as:  XANAX Take 0.5 mg by mouth 2 (two) times daily.   atomoxetine 80 MG capsule Commonly known as:  STRATTERA Take 80 mg by mouth daily.   cephALEXin 500 MG capsule Commonly known as:  KEFLEX Take 500 mg by mouth 4 (four) times daily. 10 day course starting on 08/10/2016   divalproex 500 MG DR tablet Commonly known as:  DEPAKOTE Take 500 mg by mouth 2 (two) times daily.   ibuprofen 400 MG tablet Commonly known as:  ADVIL,MOTRIN Take 1 tablet (400 mg total) by mouth every 8 (eight) hours as needed for moderate pain. What changed:  medication strength  how much to take  when to take this  reasons to take this   pantoprazole 40 MG tablet Commonly known as:  PROTONIX Take 1 tablet (40 mg total) by mouth daily. Start taking on:  08/12/2016   propranolol ER 120 MG 24 hr capsule Commonly known as:  INDERAL LA Take 120 mg by mouth every morning.   traMADol 50 MG tablet Commonly known as:  ULTRAM Take 50 mg by mouth every 6 (six) hours as needed for moderate  pain or severe pain.   venlafaxine XR 150 MG 24 hr capsule Commonly known as:  EFFEXOR-XR Take 150 mg by mouth daily with breakfast.       Allergies  Allergen Reactions  . Bee Venom Shortness Of Breath and Swelling  . Latex Swelling  . Prednisone Rash    Consultations:     Procedures/Studies: Dg Chest 2 View  Result Date: 08/10/2016 CLINICAL DATA:  36 year old male with history of chest  pain and shortness of breath when taking deep breath. EXAM: CHEST  2 VIEW COMPARISON:  No priors. FINDINGS: Mild diffuse peribronchial cuffing. Lung volumes are normal. No consolidative airspace disease. No pleural effusions. No pneumothorax. No pulmonary nodule or mass noted. Pulmonary vasculature and the cardiomediastinal silhouette are within normal limits. Atherosclerosis in the thoracic aorta. Orthopedic fixation hardware in the lower cervical spine incidentally noted. IMPRESSION: 1. Mild diffuse peribronchial cuffing, concerning for an acute bronchitis. 2. Aortic atherosclerosis. Electronically Signed   By: Trudie Reedaniel  Entrikin M.D.   On: 08/10/2016 15:32       Subjective: Patient feeling well, no further significant chest pain, no nausea no vomiting, patient tolerated a by mouth diet adequately.  Discharge Exam: Vitals:   08/11/16 0909 08/11/16 1441  BP: (!) 162/101 (!) 146/84  Pulse:  85  Resp:  20  Temp:  98.4 F (36.9 C)   Vitals:   08/11/16 0731 08/11/16 0810 08/11/16 0909 08/11/16 1441  BP: (!) 189/115 (!) 182/119 (!) 162/101 (!) 146/84  Pulse: 64   85  Resp:    20  Temp:    98.4 F (36.9 C)  TempSrc:    Oral  SpO2:    99%  Weight:      Height:        General exam: not in pain or dyspnea E ENT. No pallor or icterus.  Respiratory system: Clear to auscultation. Respiratory effort normal. No wheezing, rales or rhonchi.  Cardiovascular system: S1 & S2 heard, RRR. No JVD, murmurs, rubs, gallops or clicks. No pedal edema. Gastrointestinal system: Abdomen is nondistended, soft and nontender. No organomegaly or masses felt. Normal bowel sounds heard. Central nervous system: Alert and oriented. No focal neurological deficits. Extremities: Symmetric 5 x 5 power. Skin: No rashes, lesions or ulcers Chest pain reproducible upon chest palpation on the left.    The results of significant diagnostics from this hospitalization (including imaging, microbiology, ancillary and  laboratory) are listed below for reference.     Microbiology: No results found for this or any previous visit (from the past 240 hour(s)).   Labs: BNP (last 3 results) No results for input(s): BNP in the last 8760 hours. Basic Metabolic Panel:  Recent Labs Lab 08/10/16 1457 08/11/16 0250  NA 141 139  K 3.3* 3.6  CL 104 101  CO2 25 28  GLUCOSE 115* 105*  BUN <5* 7  CREATININE 0.72 0.70  CALCIUM 9.2 9.1   Liver Function Tests: No results for input(s): AST, ALT, ALKPHOS, BILITOT, PROT, ALBUMIN in the last 168 hours. No results for input(s): LIPASE, AMYLASE in the last 168 hours. No results for input(s): AMMONIA in the last 168 hours. CBC:  Recent Labs Lab 08/10/16 1457  WBC 9.4  HGB 15.3  HCT 43.0  MCV 86.0  PLT 209   Cardiac Enzymes:  Recent Labs Lab 08/10/16 2151 08/11/16 0250 08/11/16 0950  TROPONINI <0.03 <0.03 <0.03   BNP: Invalid input(s): POCBNP CBG: No results for input(s): GLUCAP in the last 168 hours. D-Dimer  Recent  Labs  08/11/16 1234  DDIMER <0.27   Hgb A1c No results for input(s): HGBA1C in the last 72 hours. Lipid Profile  Recent Labs  08/11/16 0250  CHOL 190  HDL 22*  LDLCALC UNABLE TO CALCULATE IF TRIGLYCERIDE OVER 400 mg/dL  TRIG 096*  CHOLHDL 8.6   Thyroid function studies No results for input(s): TSH, T4TOTAL, T3FREE, THYROIDAB in the last 72 hours.  Invalid input(s): FREET3 Anemia work up No results for input(s): VITAMINB12, FOLATE, FERRITIN, TIBC, IRON, RETICCTPCT in the last 72 hours. Urinalysis    Component Value Date/Time   COLORURINE YELLOW 01/07/2015 0010   APPEARANCEUR CLEAR 01/07/2015 0010   LABSPEC 1.020 01/07/2015 0010   PHURINE 6.0 01/07/2015 0010   GLUCOSEU NEGATIVE 01/07/2015 0010   HGBUR NEGATIVE 01/07/2015 0010   BILIRUBINUR NEGATIVE 01/07/2015 0010   KETONESUR NEGATIVE 01/07/2015 0010   PROTEINUR NEGATIVE 01/07/2015 0010   UROBILINOGEN 2.0 (H) 01/07/2015 0010   NITRITE NEGATIVE 01/07/2015 0010    LEUKOCYTESUR NEGATIVE 01/07/2015 0010   Sepsis Labs Invalid input(s): PROCALCITONIN,  WBC,  LACTICIDVEN Microbiology No results found for this or any previous visit (from the past 240 hour(s)).   Time coordinating discharge: 45 minutes  SIGNED:   Coralie Keens, MD  [facility] 08/11/2016, 4:21 PM Pager   If 7PM-7AM, please contact night-coverage [email or web address] Password [email or web address]

## 2016-10-17 ENCOUNTER — Ambulatory Visit: Payer: Medicaid Other | Admitting: Cardiovascular Disease

## 2016-10-17 ENCOUNTER — Encounter: Payer: Self-pay | Admitting: Cardiovascular Disease

## 2016-10-25 ENCOUNTER — Encounter (HOSPITAL_COMMUNITY): Payer: Self-pay | Admitting: Emergency Medicine

## 2016-10-25 ENCOUNTER — Emergency Department (HOSPITAL_COMMUNITY)
Admission: EM | Admit: 2016-10-25 | Discharge: 2016-10-25 | Disposition: A | Discharge status: Home / Self Care | Payer: Medicaid Other | Attending: Emergency Medicine | Admitting: Emergency Medicine

## 2016-10-25 DIAGNOSIS — Z91040 Latex allergy status: Secondary | ICD-10-CM | POA: Insufficient documentation

## 2016-10-25 DIAGNOSIS — Z79899 Other long term (current) drug therapy: Secondary | ICD-10-CM | POA: Diagnosis not present

## 2016-10-25 DIAGNOSIS — F909 Attention-deficit hyperactivity disorder, unspecified type: Secondary | ICD-10-CM | POA: Diagnosis not present

## 2016-10-25 DIAGNOSIS — K047 Periapical abscess without sinus: Secondary | ICD-10-CM | POA: Diagnosis not present

## 2016-10-25 DIAGNOSIS — I10 Essential (primary) hypertension: Secondary | ICD-10-CM | POA: Insufficient documentation

## 2016-10-25 DIAGNOSIS — F17200 Nicotine dependence, unspecified, uncomplicated: Secondary | ICD-10-CM | POA: Insufficient documentation

## 2016-10-25 DIAGNOSIS — J45909 Unspecified asthma, uncomplicated: Secondary | ICD-10-CM | POA: Insufficient documentation

## 2016-10-25 DIAGNOSIS — K0889 Other specified disorders of teeth and supporting structures: Secondary | ICD-10-CM | POA: Diagnosis present

## 2016-10-25 LAB — CBC WITH DIFFERENTIAL/PLATELET
Basophils Absolute: 0 10*3/uL (ref 0.0–0.1)
Basophils Relative: 0 %
Eosinophils Absolute: 1.1 10*3/uL — ABNORMAL HIGH (ref 0.0–0.7)
Eosinophils Relative: 8 %
HCT: 44 % (ref 39.0–52.0)
Hemoglobin: 15.6 g/dL (ref 13.0–17.0)
Lymphocytes Relative: 20 %
Lymphs Abs: 2.5 10*3/uL (ref 0.7–4.0)
MCH: 30.2 pg (ref 26.0–34.0)
MCHC: 35.5 g/dL (ref 30.0–36.0)
MCV: 85.3 fL (ref 78.0–100.0)
Monocytes Absolute: 1.6 10*3/uL — ABNORMAL HIGH (ref 0.1–1.0)
Monocytes Relative: 12 %
Neutro Abs: 7.7 10*3/uL (ref 1.7–7.7)
Neutrophils Relative %: 60 %
Platelets: 257 10*3/uL (ref 150–400)
RBC: 5.16 MIL/uL (ref 4.22–5.81)
RDW: 12.1 % (ref 11.5–15.5)
WBC: 13 10*3/uL — ABNORMAL HIGH (ref 4.0–10.5)

## 2016-10-25 LAB — BASIC METABOLIC PANEL
Anion gap: 9 (ref 5–15)
BUN: 5 mg/dL — ABNORMAL LOW (ref 6–20)
CO2: 29 mmol/L (ref 22–32)
Calcium: 9.5 mg/dL (ref 8.9–10.3)
Chloride: 102 mmol/L (ref 101–111)
Creatinine, Ser: 0.84 mg/dL (ref 0.61–1.24)
GFR calc Af Amer: >60 mL/min (ref >60)
GFR calc non Af Amer: >60 mL/min (ref >60)
Glucose, Bld: 147 mg/dL — ABNORMAL HIGH (ref 65–99)
Potassium: 3.3 mmol/L — ABNORMAL LOW (ref 3.5–5.1)
Sodium: 140 mmol/L (ref 135–145)

## 2016-10-25 MED ORDER — CLINDAMYCIN HCL 150 MG PO CAPS: 300.0000 mg | ORAL_CAPSULE | Freq: Four times a day (QID) | ORAL | 0 refills | Status: DC

## 2016-10-25 MED ORDER — CLINDAMYCIN PHOSPHATE 600 MG/50ML IV SOLN
600.0000 mg | Freq: Once | INTRAVENOUS | Status: AC
  Administered 2016-10-25: 600 mg via INTRAVENOUS
  Filled 2016-10-25: qty 50

## 2016-10-25 MED ORDER — HYDROCODONE-ACETAMINOPHEN 5-325 MG PO TABS: 1.0000 | ORAL_TABLET | ORAL | 0 refills | Status: DC | PRN

## 2016-10-25 MED ORDER — POTASSIUM CHLORIDE CRYS ER 20 MEQ PO TBCR
20.0000 meq | EXTENDED_RELEASE_TABLET | Freq: Once | ORAL | Status: AC
  Administered 2016-10-25: 20 meq via ORAL
  Filled 2016-10-25: qty 1

## 2016-10-25 MED ORDER — ONDANSETRON 8 MG PO TBDP: 8.0000 mg | ORAL_TABLET | Freq: Three times a day (TID) | ORAL | 0 refills | Status: DC | PRN

## 2016-10-25 MED ORDER — OXYCODONE-ACETAMINOPHEN 5-325 MG PO TABS
1.0000 | ORAL_TABLET | Freq: Once | ORAL | Status: AC
  Administered 2016-10-25: 1 via ORAL
  Filled 2016-10-25: qty 1
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
## 2016-10-25 NOTE — Discharge Instructions (Signed)
Take the clindamycin in place of your keflex, taking your next dose tomorrow morning.   You  may use the hydrocodone for pain relief but do not drive within 4 hours of taking as this will make you drowsy.  Avoid applying heat or ice to this abscess area which can worsen your symptoms.  You may use warm salt water swish and spit treatment or half peroxide and water swish and spit after meals to keep this area clean as discussed.  Keep your appointments as you have planned.  Your potassium was a little bit low today and has been replaced with the tablet given.

## 2016-11-12 ENCOUNTER — Ambulatory Visit (INDEPENDENT_AMBULATORY_CARE_PROVIDER_SITE_OTHER): Payer: Medicaid Other | Admitting: Cardiovascular Disease

## 2016-11-12 ENCOUNTER — Encounter: Payer: Self-pay | Admitting: Cardiovascular Disease

## 2016-11-12 VITALS — BP 132/89 | HR 80 | Ht 72.0 in | Wt 226.0 lb

## 2016-11-12 DIAGNOSIS — R079 Chest pain, unspecified: Secondary | ICD-10-CM | POA: Diagnosis not present

## 2016-11-12 DIAGNOSIS — Z9289 Personal history of other medical treatment: Secondary | ICD-10-CM | POA: Diagnosis not present

## 2016-11-12 DIAGNOSIS — R0602 Shortness of breath: Secondary | ICD-10-CM

## 2016-11-12 DIAGNOSIS — Z720 Tobacco use: Secondary | ICD-10-CM | POA: Diagnosis not present
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
## 2016-11-12 NOTE — Patient Instructions (Signed)
Medication Instructions:  Your physician recommends that you continue on your current medications as directed. Please refer to the Current Medication list given to you today.  Labwork: none  Testing/Procedures: Your physician has requested that you have en exercise stress myoview. For further information please visit [email or web address]. Please follow instruction sheet, as given.  Follow-Up: Your physician recommends that you schedule a follow-up appointment PENDING TEST RESULTS  Any Other Special Instructions Will Be Listed Below (If Applicable).  If you need a refill on your cardiac medications before your next appointment, please call your pharmacy.

## 2016-11-24 ENCOUNTER — Encounter (HOSPITAL_COMMUNITY)
Admission: RE | Admit: 2016-11-24 | Discharge: 2016-11-24 | Disposition: A | Discharge status: Home / Self Care | Payer: Medicaid Other | Source: Ambulatory Visit | Attending: Cardiovascular Disease | Admitting: Cardiovascular Disease

## 2016-11-24 ENCOUNTER — Encounter (HOSPITAL_BASED_OUTPATIENT_CLINIC_OR_DEPARTMENT_OTHER)
Admission: RE | Admit: 2016-11-24 | Discharge: 2016-11-24 | Disposition: A | Discharge status: Home / Self Care | Payer: Medicaid Other | Source: Ambulatory Visit | Attending: Cardiovascular Disease | Admitting: Cardiovascular Disease

## 2016-11-24 ENCOUNTER — Encounter (HOSPITAL_COMMUNITY): Payer: Self-pay

## 2016-11-24 DIAGNOSIS — R079 Chest pain, unspecified: Secondary | ICD-10-CM

## 2016-11-24 LAB — NM MYOCAR MULTI W/SPECT W/WALL MOTION / EF
Estimated workload: 8.9 METS
Exercise duration (min): 6 min
Exercise duration (sec): 37 s
LV dias vol: 136 mL (ref 62–150)
LV sys vol: 66 mL
MPHR: 184 {beats}/min
Peak HR: 112 {beats}/min
Percent HR: 60 %
RATE: 0.35
RPE: 13
Rest HR: 63 {beats}/min
SDS: 0
SRS: 0
SSS: 0
TID: 1.27

## 2016-11-24 MED ORDER — SODIUM CHLORIDE 0.9% FLUSH
INTRAVENOUS | Status: AC
  Administered 2016-11-24: 10 mL via INTRAVENOUS
  Filled 2016-11-24: qty 10

## 2016-11-24 MED ORDER — SODIUM CHLORIDE 0.9% FLUSH
INTRAVENOUS | Status: AC
  Filled 2016-11-24: qty 200

## 2016-11-24 MED ORDER — TECHNETIUM TC 99M TETROFOSMIN IV KIT
10.0000 | PACK | Freq: Once | INTRAVENOUS | Status: AC | PRN
  Administered 2016-11-24: 10 via INTRAVENOUS

## 2016-11-24 MED ORDER — TECHNETIUM TC 99M TETROFOSMIN IV KIT
30.0000 | PACK | Freq: Once | INTRAVENOUS | Status: AC | PRN
  Administered 2016-11-24: 30 via INTRAVENOUS

## 2016-11-24 MED ORDER — REGADENOSON 0.4 MG/5ML IV SOLN
INTRAVENOUS | Status: AC
  Administered 2016-11-24: 0.4 mg via INTRAVENOUS
  Filled 2016-11-24: qty 5

## 2016-11-25 ENCOUNTER — Encounter: Payer: Self-pay | Admitting: *Deleted

## 2016-11-26 ENCOUNTER — Emergency Department (HOSPITAL_COMMUNITY)
Admission: EM | Admit: 2016-11-26 | Discharge: 2016-11-26 | Disposition: A | Discharge status: Home / Self Care | Payer: Medicaid Other | Attending: Emergency Medicine | Admitting: Emergency Medicine

## 2016-11-26 ENCOUNTER — Encounter (HOSPITAL_COMMUNITY): Payer: Self-pay | Admitting: Emergency Medicine

## 2016-11-26 DIAGNOSIS — F17200 Nicotine dependence, unspecified, uncomplicated: Secondary | ICD-10-CM | POA: Diagnosis not present

## 2016-11-26 DIAGNOSIS — Z79899 Other long term (current) drug therapy: Secondary | ICD-10-CM | POA: Diagnosis not present

## 2016-11-26 DIAGNOSIS — K047 Periapical abscess without sinus: Secondary | ICD-10-CM | POA: Insufficient documentation

## 2016-11-26 DIAGNOSIS — R600 Localized edema: Secondary | ICD-10-CM | POA: Insufficient documentation

## 2016-11-26 DIAGNOSIS — R252 Cramp and spasm: Secondary | ICD-10-CM | POA: Insufficient documentation

## 2016-11-26 DIAGNOSIS — J45909 Unspecified asthma, uncomplicated: Secondary | ICD-10-CM | POA: Diagnosis not present

## 2016-11-26 DIAGNOSIS — I10 Essential (primary) hypertension: Secondary | ICD-10-CM | POA: Diagnosis not present

## 2016-11-26 DIAGNOSIS — K0889 Other specified disorders of teeth and supporting structures: Secondary | ICD-10-CM | POA: Diagnosis present

## 2016-11-26 MED ORDER — CLINDAMYCIN HCL 300 MG PO CAPS: 300.0000 mg | ORAL_CAPSULE | Freq: Three times a day (TID) | ORAL | 0 refills | Status: DC

## 2016-11-26 MED ORDER — HYDROCODONE-ACETAMINOPHEN 5-325 MG PO TABS: 1.0000 | ORAL_TABLET | Freq: Four times a day (QID) | ORAL | 0 refills | Status: DC | PRN

## 2016-11-26 NOTE — ED Provider Notes (Signed)
[facility] DEPT Provider Note   CSN: [id number] Arrival date & time: 11/26/16  1623     History   Chief Complaint Chief Complaint  Patient presents with  . Dental Pain    HPI Gary Higgins is a 36 y.o. male who presents with right sided facial swelling and right lower dental pain. Past medical history significant for history of dental abscess and smoking. He states that the swelling started this morning and has worsened throughout the day. He has had painful swallowing but has not had any fever or shortness of breath. He has an appointment with the dentist tomorrow morning. He has been taking ibuprofen and tramadol for pain with no relief.  HPI  Past Medical History:  Diagnosis Date  . Anxiety   . Asthma    occas. use of inhaler   . Depression   . GERD (gastroesophageal reflux disease)   . Hypertension   . Migraine   . Neuromuscular disorder (HCC)    CT compromise- L hand  . Sleep apnea 2014   CPAP study- uses CPAP q night, but currently machine is malfunctioning   - [facility]    Patient Active Problem List   Diagnosis Date Noted  . Chest pain 08/10/2016  . Obesity 08/10/2016  . Bipolar disorder (HCC) 08/10/2016  . H/O cervical discectomy 08/10/2016  . HTN (hypertension) 08/10/2016  . Tobacco abuse 08/10/2016  . ADHD 08/10/2016  . Asthma 08/10/2016  . OSA (obstructive sleep apnea) 08/10/2016  . HNP (herniated nucleus pulposus), cervical 05/17/2014    Past Surgical History:  Procedure Laterality Date  . ANTERIOR CERVICAL DECOMP/DISCECTOMY FUSION N/A 05/17/2014   Procedure: Cervical five-six anterior cervical decompression with fusion plating and bonegraft;  Surgeon: Coletta MemosKyle Cabbell, MD;  Location: [facility];  Service: Neurosurgery;  Laterality: N/A;  Cervical five-six anterior cervical decompression with fusion plating and bonegraft  . CARPAL TUNNEL RELEASE Right   . SCROTAL SURGERY     as a child- related to trauma  . WISDOM TOOTH EXTRACTION          Home Medications    Prior to Admission medications   Medication Sig Start Date End Date Taking? Authorizing Provider  ALPRAZolam Prudy Feeler(XANAX) 0.5 MG tablet Take 0.5 mg by mouth 2 (two) times daily.    [provider]  atomoxetine (STRATTERA) 80 MG capsule Take 80 mg by mouth daily.    [provider]  cephALEXin (KEFLEX) 500 MG capsule Take 500 mg by mouth 4 (four) times daily. 10 day course starting on 08/10/2016    [provider]  clindamycin (CLEOCIN) 300 MG capsule Take 1 capsule (300 mg total) by mouth 3 (three) times daily. 11/26/16   Bethel BornGekas, Jarreau Callanan Marie, PA-C  divalproex (DEPAKOTE) 500 MG DR tablet Take 500 mg by mouth 2 (two) times daily.    [provider]  HYDROcodone-acetaminophen (NORCO/VICODIN) 5-325 MG tablet Take 1 tablet by mouth every 6 (six) hours as needed for severe pain. 11/26/16   Bethel BornGekas, Senai Ramnath Marie, PA-C  ibuprofen (ADVIL,MOTRIN) 400 MG tablet Take 1 tablet (400 mg total) by mouth every 8 (eight) hours as needed for moderate pain. 08/11/16   Arrien, York RamMauricio Daniel, MD  ondansetron (ZOFRAN ODT) 8 MG disintegrating tablet Take 1 tablet (8 mg total) by mouth every 8 (eight) hours as needed for nausea or vomiting. 10/25/16   Idol, Raynelle FanningJulie, PA-C  pantoprazole (PROTONIX) 40 MG tablet Take 40 mg by mouth daily.    [provider]  propranolol ER (  INDERAL LA) 120 MG 24 hr capsule Take 120 mg by mouth every morning.    [provider]  traMADol (ULTRAM) 50 MG tablet Take 50 mg by mouth every 6 (six) hours as needed for moderate pain or severe pain.     [provider]  venlafaxine XR (EFFEXOR-XR) 150 MG 24 hr capsule Take 150 mg by mouth daily with breakfast.    [provider]    Family History Family History  Problem Relation Age of Onset  . CAD Maternal Grandfather     Social History Social History  Substance Use Topics  . Smoking status: Current Every Day Smoker    Packs/day: 0.50  . Smokeless  tobacco: Never Used  . Alcohol use No     Allergies   Bee venom; Latex; and Prednisone   Review of Systems Review of Systems  Constitutional: Negative for fever.  HENT: Positive for dental problem and facial swelling. Negative for drooling and trouble swallowing.   Respiratory: Negative for shortness of breath.   All other systems reviewed and are negative.    Physical Exam Updated Vital Signs BP (!) 168/102 (BP Location: Right Arm)   Pulse 79   Temp 98.5 F (36.9 C) (Oral)   Resp 18   Ht 6' (1.829 m)   Wt 104.3 kg (230 lb)   SpO2 95%   BMI 31.19 kg/m   Physical Exam  Constitutional: He is oriented to person, place, and time. He appears well-developed and well-nourished. No distress.  HENT:  Head: Normocephalic and atraumatic.  Mouth/Throat: There is trismus in the jaw. Abnormal dentition. Dental abscesses and dental caries present.  Moderate right-sided facial swelling. Moderate trismus. Poor dentition throughout. Able to speak in full sentences  Eyes: Pupils are equal, round, and reactive to light. Conjunctivae are normal. Right eye exhibits no discharge. Left eye exhibits no discharge. No scleral icterus.  Neck: Normal range of motion.  Cardiovascular: Normal rate.   Pulmonary/Chest: Effort normal. No respiratory distress.  Abdominal: He exhibits no distension.  Neurological: He is alert and oriented to person, place, and time.  Skin: Skin is warm and dry.  Psychiatric: He has a normal mood and affect. His behavior is normal.  Nursing note and vitals reviewed.    ED Treatments / Results  Labs (all labs ordered are listed, but only abnormal results are displayed) Labs Reviewed - No data to display  EKG  EKG Interpretation None       Radiology No results found.  Procedures Procedures (including critical care time)  Medications Ordered in ED Medications - No data to display   Initial Impression / Assessment and Plan / ED Course  I have reviewed  the triage vital signs and the nursing notes.  Pertinent labs & imaging results that were available during my care of the patient were reviewed by me and considered in my medical decision making (see chart for details).  Dental pain associated with dental caries and possible dental infection. Patient is afebrile, non toxic appearing, and swallowing secretions well. I gave patient referral to dentist and stressed the importance of dental follow up for ultimate management of dental pain. I have also discussed reasons to return immediately to the ER.  Patient expresses understanding and agrees with plan. He will be discharged with Clindamycin and small amt of pain medicine.  Final Clinical Impressions(s) / ED Diagnoses   Final diagnoses:  Dental abscess    New Prescriptions New Prescriptions   CLINDAMYCIN (CLEOCIN) 300 MG  CAPSULE    Take 1 capsule (300 mg total) by mouth 3 (three) times daily.   HYDROCODONE-ACETAMINOPHEN (NORCO/VICODIN) 5-325 MG TABLET    Take 1 tablet by mouth every 6 (six) hours as needed for severe pain.     Bethel Born, PA-C 11/26/16 1743    Linwood Dibbles, MD 11/27/16 [id number]

## 2016-11-26 NOTE — ED Triage Notes (Signed)
Pt reports dental pain "for awhile". Pt reports right jaw swelling started this am. Airway patent. Moderate swelling noted.

## 2016-11-26 NOTE — Discharge Instructions (Signed)
Continue Ibuprofen for pain Start Clindamycin Do not take Tramadol while you are taking Norco

## 2016-11-27 ENCOUNTER — Telehealth: Payer: Self-pay | Admitting: Cardiovascular Disease

## 2016-11-27 MED ORDER — ASPIRIN EC 81 MG PO TBEC: 81.0000 mg | DELAYED_RELEASE_TABLET | Freq: Every day | ORAL | Status: DC

## 2016-11-27 NOTE — Telephone Encounter (Signed)
Routed to Dr Koneswaran's nurse  

## 2016-11-27 NOTE — Telephone Encounter (Signed)
Given normal stress test, there is no indication to be taking ASA at all. He can proceed with surgery. He can follow up with me prn.

## 2016-11-27 NOTE — Telephone Encounter (Signed)
Patient is needing to have 7 teeth extracted by Dr. Barbette MerinoJensen, doing at day surgery.  Recent stress test was normal.  Needs cardiac clearance & how long to hold ASA prior to procedure.

## 2016-11-27 NOTE — Telephone Encounter (Signed)
Asking for test results   needs results/clearance to be able to have his tooth extracted

## 2016-11-27 NOTE — Telephone Encounter (Signed)
Patient notified.  Copy fwd to pmd & Dr. Barbette MerinoJensen for clearance.

## 2017-04-23 ENCOUNTER — Ambulatory Visit (INDEPENDENT_AMBULATORY_CARE_PROVIDER_SITE_OTHER): Payer: Medicaid Other | Admitting: Otolaryngology

## 2017-10-27 ENCOUNTER — Encounter: Payer: Self-pay | Admitting: *Deleted

## 2017-10-27 DIAGNOSIS — G43909 Migraine, unspecified, not intractable, without status migrainosus: Secondary | ICD-10-CM | POA: Insufficient documentation

## 2017-10-28 ENCOUNTER — Encounter: Payer: Self-pay | Admitting: Neurology

## 2017-10-28 ENCOUNTER — Ambulatory Visit: Payer: Medicaid Other | Admitting: Neurology

## 2017-10-28 VITALS — BP 138/88 | HR 77 | Ht 72.0 in | Wt 232.0 lb

## 2017-10-28 DIAGNOSIS — Z79899 Other long term (current) drug therapy: Secondary | ICD-10-CM | POA: Diagnosis not present

## 2017-10-28 DIAGNOSIS — G43009 Migraine without aura, not intractable, without status migrainosus: Secondary | ICD-10-CM | POA: Diagnosis not present

## 2017-10-28 MED ORDER — SUMATRIPTAN SUCCINATE 100 MG PO TABS: 100.0000 mg | ORAL_TABLET | Freq: Once | ORAL | 6 refills | Status: AC | PRN

## 2017-10-28 MED ORDER — FREMANEZUMAB-VFRM 225 MG/1.5ML ~~LOC~~ SOSY: 225.0000 mg | PREFILLED_SYRINGE | SUBCUTANEOUS | 11 refills | Status: DC

## 2017-10-28 MED ORDER — ONDANSETRON 4 MG PO TBDP: 4.0000 mg | ORAL_TABLET | Freq: Three times a day (TID) | ORAL | 6 refills | Status: DC | PRN

## 2017-10-28 NOTE — Progress Notes (Signed)
PATIENT: Gary Higgins DOB: 04-25-1980  Chief Complaint  Patient presents with  . Migraine    He estimates having a migraine 2-3 times per week.  However, over the last month, he has been having daily headaches.  He is taking ibuprofen everyday without relief.  He was only Topamax for a short period but it suppressed his appetite so he stopped it.  Marland Kitchen. PCP    Lianne Morisarroll, Erin, PA-C     HISTORICAL  Gary Higgins is a 37 year old male, seen in request by her primary care PA Lianne Morisarroll, Erin for evaluation of migraine headaches, initial evaluation was on October 28, 2017.  I have reviewed and summarized the referring note, he had a history of hyperlipidemia, bipolar disorder, PTSD, history of anterior cervical decompression and fusion in 2016.  He reported a history of migraine headaches since 2009, his typical migraine as severe pounding headache with associated light noise sensitivity, nauseous, he used to have migraine few times each week, but getting into the habit of taking ibuprofen at least 2 Tablets daily, since July 2019, he had a worsening headaches, now become daily, lasting half day, over-the-counter medicine does not touch it.  He is also on polypharmacy treatment for his bipolar disorder, previously taking Depakote 1000 mg daily, without helping his headaches, he was given Topamax, reported loss of appetite and weight gain, could not tolerate it, he recently switched to lithium, with better control of his mood disorder, he is also taking Effexor 150 mg daily, Inderal 120 mg every day, trazodone.  He cannot recall that he has tried triptan treatment in the past, previous CT of head and neck in 2015 showed no significant structural lesion.  He did drink 10 caffeine-containing beverage and use marijuana on a daily basis.  Laboratory evaluation in July 2019, normal CMP with creatinine of 0.7, LDL was mildly elevated 124, normal CBC with hemoglobin of 15.4, normal TSH, 0.45  I  reviewed his previous hospital admission in May 2018 he presented with chest pain, radiating to left arm, diaphoresis cardiac work-up showed no significant abnormality, d-dimer was negative, echocardiogram was normal.  Myocardial perfusion study in September 2018, ejection fraction of 51%,  REVIEW OF SYSTEMS: Full 14 system review of systems performed and notable only for joint pain, headache, insomnia, snoring, restless leg, not enough sleep, change in appetite, joint pain, eye pain.  ALLERGIES: Allergies  Allergen Reactions  . Bee Venom Shortness Of Breath and Swelling  . Latex Swelling  . Topamax [Topiramate] Other (See Comments)    Suppresses appetite  . Prednisone Rash    HOME MEDICATIONS: Current Outpatient Medications  Medication Sig Dispense Refill  . ALPRAZolam (XANAX) 0.5 MG tablet Take 0.5 mg by mouth 3 (three) times daily as needed.     Marland Kitchen. atomoxetine (STRATTERA) 80 MG capsule Take 80 mg by mouth daily.    . divalproex (DEPAKOTE) 500 MG DR tablet Take 1,000 mg by mouth at bedtime.    Marland Kitchen. ibuprofen (ADVIL,MOTRIN) 800 MG tablet Take 800 mg by mouth 3 (three) times daily as needed.    . lithium carbonate 150 MG capsule Take 150 mg by mouth daily.    . propranolol ER (INDERAL LA) 120 MG 24 hr capsule Take 120 mg by mouth daily.    . simvastatin (ZOCOR) 10 MG tablet Take 10 mg by mouth daily.    . traZODone (DESYREL) 50 MG tablet Take 50 mg by mouth at bedtime.    Marland Kitchen. venlafaxine XR (EFFEXOR-XR) 150  MG 24 hr capsule Take 150 mg by mouth daily with breakfast.     No current facility-administered medications for this visit.     PAST MEDICAL HISTORY: Past Medical History:  Diagnosis Date  . ADHD   . Anxiety   . Asthma    occas. use of inhaler   . Bipolar disorder (HCC)   . Depression   . GERD (gastroesophageal reflux disease)   . Hyperlipemia   . Hypertension   . Migraine   . Neuromuscular disorder (HCC)    CT compromise- L hand  . PTSD (post-traumatic stress disorder)     . Sleep apnea 2014   CPAP study- uses CPAP q night, but currently machine is malfunctioning   - done at [facility]    PAST SURGICAL HISTORY: Past Surgical History:  Procedure Laterality Date  . ANTERIOR CERVICAL DECOMP/DISCECTOMY FUSION N/A 05/17/2014   Procedure: Cervical five-six anterior cervical decompression with fusion plating and bonegraft;  Surgeon: Coletta Memos, MD;  Location: [facility];  Service: Neurosurgery;  Laterality: N/A;  Cervical five-six anterior cervical decompression with fusion plating and bonegraft  . CARPAL TUNNEL RELEASE Right   . SCROTAL SURGERY     as a child- related to trauma  . WISDOM TOOTH EXTRACTION      FAMILY HISTORY: Family History  Problem Relation Age of Onset  . CAD Maternal Grandfather   . Diabetes Mother   . COPD Father     SOCIAL HISTORY: Social History   Socioeconomic History  . Marital status: Married    Spouse name: Not on file  . Number of children: 2  . Years of education: 10th  . Highest education level: Not on file  Occupational History  . Occupation: unemployed  Social Needs  . Financial resource strain: Not on file  . Food insecurity:    Worry: Not on file    Inability: Not on file  . Transportation needs:    Medical: Not on file    Non-medical: Not on file  Tobacco Use  . Smoking status: Current Every Day Smoker    Packs/day: 1.00  . Smokeless tobacco: Never Used  Substance and Sexual Activity  . Alcohol use: No  . Drug use: Yes    Types: Marijuana    Comment: Smokes marijuana everyday   . Sexual activity: Not on file  Lifestyle  . Physical activity:    Days per week: Not on file    Minutes per session: Not on file  . Stress: Not on file  Relationships  . Social connections:    Talks on phone: Not on file    Gets together: Not on file    Attends religious service: Not on file    Active member of club or organization: Not on file    Attends meetings of clubs or organizations: Not on file    Relationship  status: Not on file  . Intimate partner violence:    Fear of current or ex partner: Not on file    Emotionally abused: Not on file    Physically abused: Not on file    Forced sexual activity: Not on file  Other Topics Concern  . Not on file  Social History Narrative   Lives at home with his wife, his mother and his son.   Right-handed.   10 cups caffeine per day (Mt Dew)     PHYSICAL EXAM   Vitals:   10/28/17 1303  BP: 138/88  Pulse: 77  Weight: 232 lb (  105.2 kg)  Height: 6' (1.829 m)    Not recorded      Body mass index is 31.46 kg/m.  PHYSICAL EXAMNIATION:  Gen: NAD, conversant, well nourised, obese, well groomed                     Cardiovascular: Regular rate rhythm, no peripheral edema, warm, nontender. Eyes: Conjunctivae clear without exudates or hemorrhage Neck: Supple, no carotid bruits. Pulmonary: Clear to auscultation bilaterally   NEUROLOGICAL EXAM:  MENTAL STATUS: Speech:    Speech is normal; fluent and spontaneous with normal comprehension.  Cognition:     Orientation to time, place and person     Normal recent and remote memory     Normal Attention span and concentration     Normal Language, naming, repeating,spontaneous speech     Fund of knowledge   CRANIAL NERVES: CN II: Visual fields are full to confrontation. Fundoscopic exam is normal with sharp discs and no vascular changes. Pupils are round equal and briskly reactive to light. CN III, IV, VI: extraocular movement are normal. No ptosis. CN V: Facial sensation is intact to pinprick in all 3 divisions bilaterally. Corneal responses are intact.  CN VII: Face is symmetric with normal eye closure and smile. CN VIII: Hearing is normal to rubbing fingers CN IX, X: Palate elevates symmetrically. Phonation is normal. CN XI: Head turning and shoulder shrug are intact CN XII: Tongue is midline with normal movements and no atrophy.  MOTOR: There is no pronator drift of out-stretched arms. Muscle  bulk and tone are normal. Muscle strength is normal.  REFLEXES: Reflexes are 2+ and symmetric at the biceps, triceps, knees, and ankles. Plantar responses are flexor.  SENSORY: Intact to light touch, pinprick, positional sensation and vibratory sensation are intact in fingers and toes.  COORDINATION: Rapid alternating movements and fine finger movements are intact. There is no dysmetria on finger-to-nose and heel-knee-shin.    GAIT/STANCE: Posture is normal. Gait is steady with normal steps, base, arm swing, and turning. Heel and toe walking are normal. Tandem gait is normal.  Romberg is absent.   DIAGNOSTIC DATA (LABS, IMAGING, TESTING) - I reviewed patient records, labs, notes, testing and imaging myself where available.   ASSESSMENT AND PLAN  Gary Higgins is a 37 y.o. male   Chronic migraine headaches,  Also a component of medicine rebound headaches, with daily ibuprofen use, 10 cups of caffeine-containing beverage, daily marijuana use,  Already on for polypharmacy treatment, including Effexor, beta-blocker, previously not responsive to Topamax, Depakote as migraine prevention  We will try a Ajovy 225 mg every month as migraine prevention  Imitrex as needed may combine it together with Zofran.   Levert FeinsteinYijun Kayloni Rocco, M.D. Ph.D.  [facility] [address], [address] [place], [place] [address] Ph: [phone] Fax: [phone]  CC:  Lianne Morisarroll, Erin, PA-C

## 2017-10-29 ENCOUNTER — Telehealth: Payer: Self-pay | Admitting: *Deleted

## 2017-10-29 NOTE — Telephone Encounter (Signed)
Ajovy [place] (ph:[phone]).  Valid through 10/23/2018.  Recipient UU#[id number] S.  HK#[id number].  [facility] pharmacy notified and will fill for the patient.

## 2018-01-06 ENCOUNTER — Emergency Department (HOSPITAL_COMMUNITY): Payer: Medicaid Other

## 2018-01-06 ENCOUNTER — Other Ambulatory Visit: Payer: Self-pay

## 2018-01-06 ENCOUNTER — Emergency Department (HOSPITAL_COMMUNITY)
Admission: EM | Admit: 2018-01-06 | Discharge: 2018-01-06 | Disposition: A | Discharge status: Home / Self Care | Payer: Medicaid Other | Attending: Emergency Medicine | Admitting: Emergency Medicine

## 2018-01-06 ENCOUNTER — Encounter (HOSPITAL_COMMUNITY): Payer: Self-pay | Admitting: Emergency Medicine

## 2018-01-06 DIAGNOSIS — I10 Essential (primary) hypertension: Secondary | ICD-10-CM | POA: Insufficient documentation

## 2018-01-06 DIAGNOSIS — J181 Lobar pneumonia, unspecified organism: Secondary | ICD-10-CM | POA: Insufficient documentation

## 2018-01-06 DIAGNOSIS — Z79899 Other long term (current) drug therapy: Secondary | ICD-10-CM | POA: Insufficient documentation

## 2018-01-06 DIAGNOSIS — J45909 Unspecified asthma, uncomplicated: Secondary | ICD-10-CM | POA: Insufficient documentation

## 2018-01-06 DIAGNOSIS — J189 Pneumonia, unspecified organism: Secondary | ICD-10-CM

## 2018-01-06 DIAGNOSIS — F17200 Nicotine dependence, unspecified, uncomplicated: Secondary | ICD-10-CM | POA: Insufficient documentation

## 2018-01-06 DIAGNOSIS — Z91040 Latex allergy status: Secondary | ICD-10-CM | POA: Insufficient documentation

## 2018-01-06 LAB — URINALYSIS, ROUTINE W REFLEX MICROSCOPIC
Bilirubin Urine: NEGATIVE
Glucose, UA: NEGATIVE mg/dL
Hgb urine dipstick: NEGATIVE
Ketones, ur: NEGATIVE mg/dL
Leukocytes, UA: NEGATIVE
Nitrite: NEGATIVE
Protein, ur: NEGATIVE mg/dL
Specific Gravity, Urine: 1.006 (ref 1.005–1.030)
pH: 6 (ref 5.0–8.0)

## 2018-01-06 MED ORDER — BENZONATATE 100 MG PO CAPS
200.0000 mg | ORAL_CAPSULE | Freq: Once | ORAL | Status: AC
  Administered 2018-01-06: 200 mg via ORAL
  Filled 2018-01-06: qty 2

## 2018-01-06 MED ORDER — AZITHROMYCIN 250 MG PO TABS: ORAL_TABLET | ORAL | 0 refills | Status: DC

## 2018-01-06 MED ORDER — BENZONATATE 100 MG PO CAPS: 200.0000 mg | ORAL_CAPSULE | Freq: Three times a day (TID) | ORAL | 0 refills | Status: DC | PRN

## 2018-01-06 MED ORDER — AZITHROMYCIN 250 MG PO TABS
500.0000 mg | ORAL_TABLET | Freq: Once | ORAL | Status: AC
  Administered 2018-01-06: 500 mg via ORAL
  Filled 2018-01-06: qty 2

## 2018-01-06 MED ORDER — CEFTRIAXONE SODIUM 1 G IJ SOLR
1.0000 g | Freq: Once | INTRAMUSCULAR | Status: AC
Start: 2018-01-06 — End: 2018-01-06
  Administered 2018-01-06: 1 g via INTRAMUSCULAR
  Filled 2018-01-06: qty 10

## 2018-01-06 MED ORDER — LIDOCAINE HCL (PF) 1 % IJ SOLN
INTRAMUSCULAR | Status: AC
  Administered 2018-01-06: 5 mL
  Filled 2018-01-06: qty 5

## 2018-01-06 MED ORDER — ACETAMINOPHEN 325 MG PO TABS
650.0000 mg | ORAL_TABLET | Freq: Once | ORAL | Status: AC
  Administered 2018-01-06: 650 mg via ORAL
  Filled 2018-01-06: qty 2

## 2018-01-06 NOTE — Discharge Instructions (Addendum)
Rest to make sure you are drinking plenty of fluids.  Take your next dose of the Zithromax tomorrow evening.

## 2018-01-06 NOTE — ED Triage Notes (Signed)
Patient reports productive cough, body aches, and chills for 2 days.

## 2018-01-06 NOTE — ED Notes (Signed)
Patient transported to X-ray 
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
## 2018-02-03 ENCOUNTER — Encounter (HOSPITAL_COMMUNITY): Payer: Self-pay | Admitting: Emergency Medicine

## 2018-02-03 ENCOUNTER — Emergency Department (HOSPITAL_COMMUNITY)
Admission: EM | Admit: 2018-02-03 | Discharge: 2018-02-03 | Disposition: A | Discharge status: Home / Self Care | Payer: Medicaid Other | Attending: Emergency Medicine | Admitting: Emergency Medicine

## 2018-02-03 ENCOUNTER — Other Ambulatory Visit: Payer: Self-pay

## 2018-02-03 DIAGNOSIS — Z79899 Other long term (current) drug therapy: Secondary | ICD-10-CM | POA: Insufficient documentation

## 2018-02-03 DIAGNOSIS — F17210 Nicotine dependence, cigarettes, uncomplicated: Secondary | ICD-10-CM | POA: Insufficient documentation

## 2018-02-03 DIAGNOSIS — J45909 Unspecified asthma, uncomplicated: Secondary | ICD-10-CM | POA: Insufficient documentation

## 2018-02-03 DIAGNOSIS — K0889 Other specified disorders of teeth and supporting structures: Secondary | ICD-10-CM | POA: Insufficient documentation

## 2018-02-03 MED ORDER — HYDROCODONE-ACETAMINOPHEN 5-325 MG PO TABS
1.0000 | ORAL_TABLET | Freq: Once | ORAL | Status: AC
  Administered 2018-02-03: 1 via ORAL
  Filled 2018-02-03: qty 1

## 2018-02-03 MED ORDER — PENICILLIN V POTASSIUM 500 MG PO TABS: 500.0000 mg | ORAL_TABLET | Freq: Four times a day (QID) | ORAL | 0 refills | Status: DC

## 2018-02-03 MED ORDER — ONDANSETRON 8 MG PO TBDP
8.0000 mg | ORAL_TABLET | Freq: Once | ORAL | Status: AC
  Administered 2018-02-03: 8 mg via ORAL
  Filled 2018-02-03: qty 1

## 2018-02-03 NOTE — ED Triage Notes (Signed)
Patient reports dental pain x 2 days, started vomiting today.
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
## 2018-03-19 NOTE — Progress Notes (Deleted)
[facility]  PATIENT: Gary Higgins DOB: [birth year]   REASON FOR VISIT: *** HISTORY FROM:    HISTORY OF PRESENT ILLNESS: 8/14/19YYRobert D Higgins is a 38 year old male, seen in request by her primary care PA Gary Higgins for evaluation of migraine headaches, initial evaluation was on October 28, 2017.  I have reviewed and summarized the referring note, he had a history of hyperlipidemia, bipolar disorder, PTSD, history of anterior cervical decompression and fusion in 2016.  He reported a history of migraine headaches since 2009, his typical migraine as severe pounding headache with associated light noise sensitivity, nauseous, he used to have migraine few times each week, but getting into the habit of taking ibuprofen at least 2 Tablets daily, since July 2019, he had a worsening headaches, now become daily, lasting half day, over-the-counter medicine does not touch it.  He is also on polypharmacy treatment for his bipolar disorder, previously taking Depakote 1000 mg daily, without helping his headaches, he was given Topamax, reported loss of appetite and weight gain, could not tolerate it, he recently switched to lithium, with better control of his mood disorder, he is also taking Effexor 150 mg daily, Inderal 120 mg every day, trazodone.  He cannot recall that he has tried triptan treatment in the past, previous CT of head and neck in 2015 showed no significant structural lesion.  He did drink 10 caffeine-containing beverage and use marijuana on a daily basis.  Laboratory evaluation in July 2019, normal CMP with creatinine of 0.7, LDL was mildly elevated 124, normal CBC with hemoglobin of 15.4, normal TSH, 0.45  I reviewed his previous hospital admission in May 2018 he presented with chest pain, radiating to left arm, diaphoresis cardiac work-up showed no significant abnormality, d-dimer was negative, echocardiogram was normal.  Myocardial perfusion  study in September 2018, ejection fraction of 51%,   REVIEW OF SYSTEMS: Full 14 system review of systems performed and notable only for those listed, all others are neg:  Constitutional: neg  Cardiovascular: neg Ear/Nose/Throat: neg  Skin: neg Eyes: neg Respiratory: neg Gastroitestinal: neg  Hematology/Lymphatic: neg  Endocrine: neg Musculoskeletal:neg Allergy/Immunology: neg Neurological: neg Psychiatric: neg Sleep : neg   ALLERGIES: Allergies  Allergen Reactions  . Bee Venom Shortness Of Breath and Swelling  . Latex Swelling  . Topamax [Topiramate] Other (See Comments)    Suppresses appetite  . Prednisone Rash    HOME MEDICATIONS: Outpatient Medications Prior to Visit  Medication Sig Dispense Refill  . ALPRAZolam (XANAX) 0.5 MG tablet Take 0.5 mg by mouth 3 (three) times daily as needed.     Marland Kitchen atomoxetine (STRATTERA) 80 MG capsule Take 80 mg by mouth daily.    . Fremanezumab-vfrm (AJOVY) 225 MG/1.5ML SOSY Inject 225 mg into the skin every 30 (thirty) days. 1 Syringe 11  . ibuprofen (ADVIL,MOTRIN) 800 MG tablet Take 800 mg by mouth 3 (three) times daily as needed.    . lithium carbonate 150 MG capsule Take 150 mg by mouth daily.    . ondansetron (ZOFRAN ODT) 4 MG disintegrating tablet Take 1 tablet (4 mg total) by mouth every 8 (eight) hours as needed. 20 tablet 6  . penicillin v potassium (VEETID) 500 MG tablet Take 1 tablet (500 mg total) by mouth 4 (four) times daily. 28 tablet 0  . propranolol ER (INDERAL LA) 120 MG 24 hr capsule Take 120 mg by mouth daily.    . simvastatin (ZOCOR) 10 MG tablet Take 10 mg by mouth daily.    Marland Kitchen  SUMAtriptan (IMITREX) 100 MG tablet Take 1 tablet (100 mg total) by mouth once as needed for up to 1 dose for migraine. May repeat in 2 hours if headache persists or recurs. 12 tablet 6  . traZODone (DESYREL) 50 MG tablet Take 50 mg by mouth at bedtime.    Marland Kitchen. venlafaxine XR (EFFEXOR-XR) 150 MG 24 hr capsule Take 150 mg by mouth daily with  breakfast.     No facility-administered medications prior to visit.     PAST MEDICAL HISTORY: Past Medical History:  Diagnosis Date  . ADHD   . Anxiety   . Asthma    occas. use of inhaler   . Bipolar disorder (HCC)   . Depression   . GERD (gastroesophageal reflux disease)   . Hyperlipemia   . Hypertension   . Migraine   . Neuromuscular disorder (HCC)    CT compromise- L hand  . PTSD (post-traumatic stress disorder)   . Sleep apnea 2014   CPAP study- uses CPAP q night, but currently machine is malfunctioning   - done at [facility]    PAST SURGICAL HISTORY: Past Surgical History:  Procedure Laterality Date  . ANTERIOR CERVICAL DECOMP/DISCECTOMY FUSION N/A 05/17/2014   Procedure: Cervical five-six anterior cervical decompression with fusion plating and bonegraft;  Surgeon: Gary MemosKyle Cabbell, MD;  Location: [facility];  Service: Neurosurgery;  Laterality: N/A;  Cervical five-six anterior cervical decompression with fusion plating and bonegraft  . CARPAL TUNNEL RELEASE Right   . SCROTAL SURGERY     as a child- related to trauma  . WISDOM TOOTH EXTRACTION      FAMILY HISTORY: Family History  Problem Relation Age of Onset  . CAD Maternal Grandfather   . Diabetes Mother   . COPD Father     SOCIAL HISTORY: Social History   Socioeconomic History  . Marital status: Married    Spouse name: Not on file  . Number of children: 2  . Years of education: 10th  . Highest education level: Not on file  Occupational History  . Occupation: unemployed  Social Needs  . Financial resource strain: Not on file  . Food insecurity:    Worry: Not on file    Inability: Not on file  . Transportation needs:    Medical: Not on file    Non-medical: Not on file  Tobacco Use  . Smoking status: Current Every Day Smoker    Packs/day: 1.00  . Smokeless tobacco: Never Used  Substance and Sexual Activity  . Alcohol use: No  . Drug use: Yes    Types: Marijuana    Comment: Smokes marijuana  everyday   . Sexual activity: Not on file  Lifestyle  . Physical activity:    Days per week: Not on file    Minutes per session: Not on file  . Stress: Not on file  Relationships  . Social connections:    Talks on phone: Not on file    Gets together: Not on file    Attends religious service: Not on file    Active member of club or organization: Not on file    Attends meetings of clubs or organizations: Not on file    Relationship status: Not on file  . Intimate partner violence:    Fear of current or ex partner: Not on file    Emotionally abused: Not on file    Physically abused: Not on file    Forced sexual activity: Not on file  Other Topics Concern  .  Not on file  Social History Narrative   Lives at home with his wife, his mother and his son.   Right-handed.   10 cups caffeine per day [facility]     PHYSICAL EXAM  There were no vitals filed for this visit. There is no height or weight on file to calculate BMI.  Generalized: Well developed, in no acute distress  Head: normocephalic and atraumatic,. Oropharynx benign  Neck: Supple, no carotid bruits  Cardiac: Regular rate rhythm, no murmur  Musculoskeletal: No deformity   Neurological examination   Mentation: Alert oriented to time, place, history taking. Attention span and concentration appropriate. Recent and remote memory intact.  Follows all commands speech and language fluent.   Cranial nerve II-XII: Fundoscopic exam reveals sharp disc margins.Pupils were equal round reactive to light extraocular movements were full, visual field were full on confrontational test. Facial sensation and strength were normal. hearing was intact to finger rubbing bilaterally. Uvula tongue midline. head turning and shoulder shrug were normal and symmetric.Tongue protrusion into cheek strength was normal. Motor: normal bulk and tone, full strength in the BUE, BLE, fine finger movements normal, no pronator drift. No focal weakness Sensory:  normal and symmetric to light touch, pinprick, and  Vibration, proprioception  Coordination: finger-nose-finger, heel-to-shin bilaterally, no dysmetria Reflexes: Brachioradialis 2/2, biceps 2/2, triceps 2/2, patellar 2/2, Achilles 2/2, plantar responses were flexor bilaterally. Gait and Station: Rising up from seated position without assistance, normal stance,  moderate stride, good arm swing, smooth turning, able to perform tiptoe, and heel walking without difficulty. Tandem gait is steady  DIAGNOSTIC DATA (LABS, IMAGING, TESTING) - I reviewed patient records, labs, notes, testing and imaging myself where available.  Lab Results  Component Value Date   WBC 13.0 (H) 10/25/2016   HGB 15.6 10/25/2016   HCT 44.0 10/25/2016   MCV 85.3 10/25/2016   PLT 257 10/25/2016      Component Value Date/Time   NA 140 10/25/2016 1817   K 3.3 (L) 10/25/2016 1817   CL 102 10/25/2016 1817   CO2 29 10/25/2016 1817   GLUCOSE 147 (H) 10/25/2016 1817   BUN 5 (L) 10/25/2016 1817   CREATININE 0.84 10/25/2016 1817   CALCIUM 9.5 10/25/2016 1817   PROT 7.6 12/23/2014 1800   ALBUMIN 4.6 12/23/2014 1800   AST 31 12/23/2014 1800   ALT 62 12/23/2014 1800   ALKPHOS 92 12/23/2014 1800   BILITOT 0.7 12/23/2014 1800   GFRNONAA >60 10/25/2016 1817   GFRAA >60 10/25/2016 1817   Lab Results  Component Value Date   CHOL 190 08/11/2016   HDL 22 (L) 08/11/2016   LDLCALC UNABLE TO CALCULATE IF TRIGLYCERIDE OVER 400 mg/dL 16/10/960405/28/2018   TRIG 540670 (H) 08/11/2016   CHOLHDL 8.6 08/11/2016   No results found for: HGBA1C No results found for: VITAMINB12 No results found for: TSH  ***  ASSESSMENT AND PLAN  38 y.o. year old male  has a past medical history of ADHD, Anxiety, Asthma, Bipolar disorder (HCC), Depression, GERD (gastroesophageal reflux disease), Hyperlipemia, Hypertension, Migraine, Neuromuscular disorder (HCC), PTSD (post-traumatic stress disorder), and Sleep apnea (2014). here with ***  Hinton [name]  is a 38 y.o. male   Chronic migraine headaches,             Also a component of medicine rebound headaches, with daily ibuprofen use, 10 cups of caffeine-containing beverage, daily marijuana use,             Already on for polypharmacy treatment, including  Effexor, beta-blocker, previously not responsive to Topamax, Depakote as migraine prevention             We will try a Ajovy 225 mg every month as migraine prevention             Imitrex as needed may combine it together with Zofran.  Nilda Riggs, [facility], [facility], APRN  [facility] [address] [place], [place] [address] [phone]

## 2018-03-23 ENCOUNTER — Ambulatory Visit: Payer: Medicaid Other | Admitting: Nurse Practitioner

## 2018-03-23 ENCOUNTER — Telehealth: Payer: Self-pay | Admitting: *Deleted

## 2018-03-23 NOTE — Telephone Encounter (Signed)
Patient was no show for follow up with NP today.  

## 2018-03-24 ENCOUNTER — Encounter: Payer: Self-pay | Admitting: Nurse Practitioner

## 2020-05-08 ENCOUNTER — Other Ambulatory Visit: Payer: Self-pay

## 2020-05-08 ENCOUNTER — Emergency Department (HOSPITAL_COMMUNITY)
Admission: EM | Admit: 2020-05-08 | Discharge: 2020-05-08 | Disposition: A | Discharge status: Home / Self Care | Payer: Self-pay | Attending: Emergency Medicine | Admitting: Emergency Medicine

## 2020-05-08 ENCOUNTER — Encounter (HOSPITAL_COMMUNITY): Payer: Self-pay | Admitting: Emergency Medicine

## 2020-05-08 DIAGNOSIS — J069 Acute upper respiratory infection, unspecified: Secondary | ICD-10-CM

## 2020-05-08 DIAGNOSIS — I10 Essential (primary) hypertension: Secondary | ICD-10-CM | POA: Insufficient documentation

## 2020-05-08 DIAGNOSIS — F17200 Nicotine dependence, unspecified, uncomplicated: Secondary | ICD-10-CM | POA: Insufficient documentation

## 2020-05-08 DIAGNOSIS — Z20822 Contact with and (suspected) exposure to covid-19: Secondary | ICD-10-CM | POA: Insufficient documentation

## 2020-05-08 DIAGNOSIS — R070 Pain in throat: Secondary | ICD-10-CM | POA: Insufficient documentation

## 2020-05-08 DIAGNOSIS — Z91040 Latex allergy status: Secondary | ICD-10-CM | POA: Insufficient documentation

## 2020-05-08 DIAGNOSIS — R0981 Nasal congestion: Secondary | ICD-10-CM | POA: Insufficient documentation

## 2020-05-08 DIAGNOSIS — J45909 Unspecified asthma, uncomplicated: Secondary | ICD-10-CM | POA: Insufficient documentation

## 2020-05-08 DIAGNOSIS — Z79899 Other long term (current) drug therapy: Secondary | ICD-10-CM | POA: Insufficient documentation

## 2020-05-08 DIAGNOSIS — J029 Acute pharyngitis, unspecified: Secondary | ICD-10-CM

## 2020-05-08 LAB — GROUP A STREP BY PCR: Group A Strep by PCR: NOT DETECTED

## 2020-05-08 LAB — SARS CORONAVIRUS 2 (TAT 6-24 HRS): SARS Coronavirus 2: NEGATIVE

## 2020-05-08 NOTE — Discharge Instructions (Addendum)
Please sign up for MyChart.  You will be able to find the results of your Covid test on MyChart.

## 2020-05-08 NOTE — ED Provider Notes (Signed)
[facility] EMERGENCY DEPARTMENT Provider Note   CSN: [id number] Arrival date & time: 05/08/20  0040   History Chief Complaint  Patient presents with  . Sore Throat    Gary Higgins is a 40 y.o. male.  The history is provided by the patient.  Sore Throat  He has history of hypertension, hyperlipidemia, bipolar disorder and comes in with 2-day history of sore throat and some sinus congestion.  He denies fever, chills, sweats.  There has been no change in his chronic smoker's cough.  He denies loss of sense of smell or taste.  He denies arthralgias or myalgias.  He denies any sick contacts.  He is concerned that he may have Covid although he is not aware of any contact with anyone who has had Covid.  He has not been vaccinated.  Past Medical History:  Diagnosis Date  . ADHD   . Anxiety   . Asthma    occas. use of inhaler   . Bipolar disorder (HCC)   . Depression   . GERD (gastroesophageal reflux disease)   . Hyperlipemia   . Hypertension   . Migraine   . Neuromuscular disorder (HCC)    CT compromise- L hand  . PTSD (post-traumatic stress disorder)   . Sleep apnea 2014   CPAP study- uses CPAP q night, but currently machine is malfunctioning   - done at [facility]    Patient Active Problem List   Diagnosis Date Noted  . Polypharmacy 10/28/2017  . Migraine 10/27/2017  . Chest pain 08/10/2016  . Obesity 08/10/2016  . Bipolar disorder (HCC) 08/10/2016  . H/O cervical discectomy 08/10/2016  . HTN (hypertension) 08/10/2016  . Tobacco abuse 08/10/2016  . ADHD 08/10/2016  . Asthma 08/10/2016  . OSA (obstructive sleep apnea) 08/10/2016  . HNP (herniated nucleus pulposus), cervical 05/17/2014    Past Surgical History:  Procedure Laterality Date  . ANTERIOR CERVICAL DECOMP/DISCECTOMY FUSION N/A 05/17/2014   Procedure: Cervical five-six anterior cervical decompression with fusion plating and bonegraft;  Surgeon: Coletta Memos, MD;  Location: [facility];  Service:  Neurosurgery;  Laterality: N/A;  Cervical five-six anterior cervical decompression with fusion plating and bonegraft  . CARPAL TUNNEL RELEASE Right   . SCROTAL SURGERY     as a child- related to trauma  . WISDOM TOOTH EXTRACTION         Family History  Problem Relation Age of Onset  . CAD Maternal Grandfather   . Diabetes Mother   . COPD Father     Social History   Tobacco Use  . Smoking status: Current Every Day Smoker    Packs/day: 1.00  . Smokeless tobacco: Never Used  Vaping Use  . Vaping Use: Never used  Substance Use Topics  . Alcohol use: No  . Drug use: Yes    Types: Marijuana    Comment: Smokes marijuana everyday     Home Medications Prior to Admission medications   Medication Sig Start Date End Date Taking? Authorizing Provider  ALPRAZolam Prudy Feeler) 0.5 MG tablet Take 0.5 mg by mouth 3 (three) times daily as needed.     [provider]  atomoxetine (STRATTERA) 80 MG capsule Take 80 mg by mouth daily.    [provider]  Fremanezumab-vfrm (AJOVY) 225 MG/1.5ML SOSY Inject 225 mg into the skin every 30 (thirty) days. 10/28/17   Levert Feinstein, MD  ibuprofen (ADVIL,MOTRIN) 800 MG tablet Take 800 mg by mouth 3 (three) times daily as needed.    [provider]  lithium carbonate 150 MG capsule Take 150 mg by mouth daily.    [provider]  ondansetron (ZOFRAN ODT) 4 MG disintegrating tablet Take 1 tablet (4 mg total) by mouth every 8 (eight) hours as needed. 10/28/17   Levert Feinstein, MD  penicillin v potassium (VEETID) 500 MG tablet Take 1 tablet (500 mg total) by mouth 4 (four) times daily. 02/03/18   Zadie Rhine, MD  propranolol ER (INDERAL LA) 120 MG 24 hr capsule Take 120 mg by mouth daily.    [provider]  simvastatin (ZOCOR) 10 MG tablet Take 10 mg by mouth daily.    [provider]  SUMAtriptan (IMITREX) 100 MG tablet Take 1 tablet (100 mg total) by mouth once as needed for up to 1 dose for migraine. May repeat  in 2 hours if headache persists or recurs. 10/28/17   Levert Feinstein, MD  traZODone (DESYREL) 50 MG tablet Take 50 mg by mouth at bedtime.    [provider]  venlafaxine XR (EFFEXOR-XR) 150 MG 24 hr capsule Take 150 mg by mouth daily with breakfast.    [provider]    Allergies    Bee venom, Latex, Topamax [topiramate], and Prednisone  Review of Systems   Review of Systems  All other systems reviewed and are negative.   Physical Exam Updated Vital Signs BP 139/90   Pulse 68   Temp 98.8 F (37.1 C) (Oral)   Resp 18   Ht 6\' 1"  (1.854 m)   Wt 97.5 kg   SpO2 98%   BMI 28.37 kg/m   Physical Exam Vitals and nursing note reviewed.   40 year old male, resting comfortably and in no acute distress. Vital signs are normal. Oxygen saturation is 98%, which is normal. Head is normocephalic and atraumatic.  There is mild tenderness to palpation over the maxillary sinuses.  PERRLA, EOMI. Oropharynx is mildly erythematous without exudate.  There is no pooling of secretions, and phonation is normal.. Neck is nontender and supple without adenopathy or JVD. Back is nontender and there is no CVA tenderness. Lungs are clear without rales, wheezes, or rhonchi. Chest is nontender. Heart has regular rate and rhythm without murmur. Abdomen is soft, flat, nontender without masses or hepatosplenomegaly and peristalsis is normoactive. Extremities have no cyanosis or edema, full range of motion is present. Skin is warm and dry without rash. Neurologic: Mental status is normal, cranial nerves are intact, there are no motor or sensory deficits.  ED Results / Procedures / Treatments   Labs (all labs ordered are listed, but only abnormal results are displayed) Labs Reviewed  GROUP A STREP BY PCR  SARS CORONAVIRUS 2 (TAT 6-24 HRS)   Procedures Procedures   Medications Ordered in ED Medications - No data to display  ED Course  I have reviewed the triage vital signs and the nursing  notes.  Pertinent lab results that were available during my care of the patient were reviewed by me and considered in my medical decision making (see chart for details).  MDM Rules/Calculators/A&P Sore throat which is probably viral.  Strep screen is sent.  Doubt COVID-19, but COVID-19 swab is also sent.  Old records are reviewed, and he has no relevant past visits.  Strep PCR is negative, Covid pending.  Unfortunately, he is allergic to steroids so I do not want a given a dose of dexamethasone.  Is advised to use over-the-counter medications as needed.  24 was evaluated in  Emergency Department on 05/08/2020 for the symptoms described in the history of present illness. He was evaluated in the context of the global COVID-19 pandemic, which necessitated consideration that the patient might be at risk for infection with the SARS-CoV-2 virus that causes COVID-19. Institutional protocols and algorithms that pertain to the evaluation of patients at risk for COVID-19 are in a state of rapid change based on information released by regulatory bodies including the CDC and federal and state organizations. These policies and algorithms were followed during the patient's care in the ED.  Final Clinical Impression(s) / ED Diagnoses Final diagnoses:  None    Rx / DC Orders ED Discharge Orders    None       Dione Booze, MD 05/08/20 [phone]

## 2020-05-08 NOTE — ED Triage Notes (Signed)
Pt states he has had a "scratchy and sore throat" since yesterday. Also c/o headache. Pt thinks it may be his "sinuses" but states that his work wants him tested for Covid. Pt has not had any vaccinations for Covid.

## 2020-09-12 ENCOUNTER — Emergency Department (HOSPITAL_COMMUNITY)
Admission: EM | Admit: 2020-09-12 | Discharge: 2020-09-12 | Disposition: A | Discharge status: Home / Self Care | Payer: Self-pay | Attending: Emergency Medicine | Admitting: Emergency Medicine

## 2020-09-12 ENCOUNTER — Emergency Department (HOSPITAL_COMMUNITY): Payer: Self-pay

## 2020-09-12 ENCOUNTER — Other Ambulatory Visit: Payer: Self-pay

## 2020-09-12 ENCOUNTER — Encounter (HOSPITAL_COMMUNITY): Payer: Self-pay

## 2020-09-12 DIAGNOSIS — M7732 Calcaneal spur, left foot: Secondary | ICD-10-CM

## 2020-09-12 DIAGNOSIS — I10 Essential (primary) hypertension: Secondary | ICD-10-CM | POA: Insufficient documentation

## 2020-09-12 DIAGNOSIS — F17210 Nicotine dependence, cigarettes, uncomplicated: Secondary | ICD-10-CM | POA: Insufficient documentation

## 2020-09-12 DIAGNOSIS — J45909 Unspecified asthma, uncomplicated: Secondary | ICD-10-CM | POA: Insufficient documentation

## 2020-09-12 DIAGNOSIS — Z91040 Latex allergy status: Secondary | ICD-10-CM | POA: Insufficient documentation

## 2020-09-12 DIAGNOSIS — M722 Plantar fascial fibromatosis: Secondary | ICD-10-CM

## 2020-09-12 DIAGNOSIS — Z79899 Other long term (current) drug therapy: Secondary | ICD-10-CM | POA: Insufficient documentation

## 2020-09-12 MED ORDER — HYDROCODONE-ACETAMINOPHEN 5-325 MG PO TABS: ORAL_TABLET | ORAL | 0 refills | Status: DC

## 2020-09-12 NOTE — Discharge Instructions (Addendum)
Elevate your foot when possible.  It is very important that you wear supportive shoes.  You may also want to purchase inserts for your shoes for Planter fasciitis.  Try to avoid sandals or going barefoot.  You may try stretching the bottom of your foot over a cold bottle or can.  Continue taking 800 mg ibuprofen 3 times a day with food.  Call the podiatrist listed to arrange a follow-up appointment in 1 week if not improving.

## 2020-09-12 NOTE — ED Triage Notes (Signed)
Pt to er, pt c/o L foot pain, states that the pain started yesterday, denies injury or trauma.

## 2020-09-12 NOTE — ED Provider Notes (Signed)
[facility] EMERGENCY DEPARTMENT Provider Note   CSN: [id number] Arrival date & time: 09/12/20  1703     History Chief Complaint  Patient presents with   Foot Pain    Gary Higgins is a 40 y.o. male.   Foot Pain Pertinent negatives include no chest pain and no shortness of breath.       Gary Higgins is a 40 y.o. male who presents to the Emergency Department complaining of left foot pain and swelling.  Symptoms present for one day.  He describes swelling along to base of the outer left ankle and pain to his posterior heel and arch.  Pain is worse upon standing in the morning and gradually improves through the day.  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  No known injury, redness, numbness, calf pain or symptoms to the toes or distal foot.   Past Medical History:  Diagnosis Date   ADHD    Anxiety    Asthma    occas. use of inhaler    Bipolar disorder (HCC)    Depression    GERD (gastroesophageal reflux disease)    Hyperlipemia    Hypertension    Migraine    Neuromuscular disorder (HCC)    CT compromise- L hand   PTSD (post-traumatic stress disorder)    Sleep apnea 2014   CPAP study- uses CPAP q night, but currently machine is malfunctioning   - done at [facility]    Patient Active Problem List   Diagnosis Date Noted   Polypharmacy 10/28/2017   Migraine 10/27/2017   Chest pain 08/10/2016   Obesity 08/10/2016   Bipolar disorder (HCC) 08/10/2016   H/O cervical discectomy 08/10/2016   HTN (hypertension) 08/10/2016   Tobacco abuse 08/10/2016   ADHD 08/10/2016   Asthma 08/10/2016   OSA (obstructive sleep apnea) 08/10/2016   HNP (herniated nucleus pulposus), cervical 05/17/2014    Past Surgical History:  Procedure Laterality Date   ANTERIOR CERVICAL DECOMP/DISCECTOMY FUSION N/A 05/17/2014   Procedure: Cervical five-six anterior cervical decompression with fusion plating and bonegraft;  Surgeon: Coletta Memos, MD;  Location: [facility];   Service: Neurosurgery;  Laterality: N/A;  Cervical five-six anterior cervical decompression with fusion plating and bonegraft   CARPAL TUNNEL RELEASE Right    SCROTAL SURGERY     as a child- related to trauma   WISDOM TOOTH EXTRACTION         Family History  Problem Relation Age of Onset   CAD Maternal Grandfather    Diabetes Mother    COPD Father     Social History   Tobacco Use   Smoking status: Every Day    Packs/day: 1.00    Pack years: 0.00    Types: Cigarettes   Smokeless tobacco: Never  Vaping Use   Vaping Use: Never used  Substance Use Topics   Alcohol use: No   Drug use: Yes    Types: Marijuana    Comment: Smokes marijuana everyday     Home Medications Prior to Admission medications   Medication Sig Start Date End Date Taking? Authorizing Provider  ALPRAZolam Prudy Feeler) 0.5 MG tablet Take 0.5 mg by mouth 3 (three) times daily as needed.     [provider]  atomoxetine (STRATTERA) 80 MG capsule Take 80 mg by mouth daily.    [provider]  Fremanezumab-vfrm (AJOVY) 225 MG/1.5ML SOSY Inject 225 mg into the skin every 30 (thirty) days. 10/28/17   Levert Feinstein, MD  ibuprofen (ADVIL,MOTRIN) 800 MG tablet Take 800 mg by mouth 3 (three) times daily as needed.    [provider]  lithium carbonate 150 MG capsule Take 150 mg by mouth daily.    [provider]  ondansetron (ZOFRAN ODT) 4 MG disintegrating tablet Take 1 tablet (4 mg total) by mouth every 8 (eight) hours as needed. 10/28/17   Levert Feinstein, MD  penicillin v potassium (VEETID) 500 MG tablet Take 1 tablet (500 mg total) by mouth 4 (four) times daily. 02/03/18   Zadie Rhine, MD  propranolol ER (INDERAL LA) 120 MG 24 hr capsule Take 120 mg by mouth daily.    [provider]  simvastatin (ZOCOR) 10 MG tablet Take 10 mg by mouth daily.    [provider]  SUMAtriptan (IMITREX) 100 MG tablet Take 1 tablet (100 mg total) by mouth once as needed for up to 1 dose for  migraine. May repeat in 2 hours if headache persists or recurs. 10/28/17   Levert Feinstein, MD  traZODone (DESYREL) 50 MG tablet Take 50 mg by mouth at bedtime.    [provider]  venlafaxine XR (EFFEXOR-XR) 150 MG 24 hr capsule Take 150 mg by mouth daily with breakfast.    [provider]    Allergies    Bee venom, Latex, Topamax [topiramate], and Prednisone  Review of Systems   Review of Systems  Constitutional:  Negative for chills and fever.  Respiratory:  Negative for shortness of breath.   Cardiovascular:  Negative for chest pain and leg swelling.  Gastrointestinal:  Negative for nausea and vomiting.  Genitourinary:  Negative for dysuria, flank pain and hematuria.  Musculoskeletal:  Positive for arthralgias (left foot pain and swelling). Negative for back pain and neck pain.  Skin:  Negative for color change, rash and wound.  Neurological:  Negative for weakness and numbness.  Hematological:  Does not bruise/bleed easily.   Physical Exam Updated Vital Signs BP (!) 147/101 (BP Location: Right Arm)   Pulse 86   Temp 99.3 F (37.4 C) (Oral)   Resp 18   Ht 6' (1.829 m)   Wt 95.3 kg   SpO2 96%   BMI 28.48 kg/m   Physical Exam Vitals and nursing note reviewed.  Constitutional:      Appearance: Normal appearance.  HENT:     Head: Normocephalic.  Cardiovascular:     Rate and Rhythm: Normal rate and regular rhythm.     Pulses: Normal pulses.  Pulmonary:     Effort: Pulmonary effort is normal.     Breath sounds: Normal breath sounds.  Musculoskeletal:        General: Tenderness present. Normal range of motion.     Left foot: Normal capillary refill. Tenderness present. Normal pulse.     Comments: Ttp of the lateral left foot, left arch and base of the Achilles tendon, tendon appears in tact. Mild edema noted to the hind foot.  No calf tenderness or palpable cord  Skin:    General: Skin is warm.     Capillary Refill: Capillary refill takes less than 2  seconds.     Findings: No rash.  Neurological:     General: No focal deficit present.     Mental Status: He is alert.     Sensory: No sensory deficit.     Motor: No weakness.    ED Results / Procedures / Treatments   Labs (all labs ordered are listed, but only abnormal results are displayed)  Labs Reviewed - No data to display  EKG None  Radiology DG Foot Complete Left  Result Date: 09/12/2020 CLINICAL DATA:  Pain, swelling in left heel EXAM: LEFT FOOT - COMPLETE 3+ VIEW COMPARISON:  None. FINDINGS: Early joint space narrowing in the 1st MTP joint. No acute bony abnormality. Specifically, no fracture, subluxation, or dislocation. Small posterior and plantar calcaneal spur. IMPRESSION: Small calcaneal spurs. No acute bony abnormality. Electronically Signed   By: Charlett Nose M.D.   On: 09/12/2020 19:07    Procedures Procedures   Medications Ordered in ED Medications - No data to display  ED Course  I have reviewed the triage vital signs and the nursing notes.  Pertinent labs & imaging results that were available during my care of the patient were reviewed by me and considered in my medical decision making (see chart for details).    MDM Rules/Calculators/A&P                          Pt with non traumatic left foot pain.  NV intact.  No erythema, excessive warmth or wound to suggest cellulitis.  Sx's likely plantar fasciitis.    X-ray shows calcaneal spurs.  No fractures.  Patient agrees to symptomatic treatment with NSAID, will provide short course of pain medication and he is agreeable to close follow-up with podiatry.  I have recommended supportive shoes and orthotics.  Final Clinical Impression(s) / ED Diagnoses Final diagnoses:  Plantar fasciitis  Calcaneal spur of left foot    Rx / DC Orders ED Discharge Orders     None        Rosey Bath 09/12/20 Townsend Roger, MD 09/14/20 1032

## 2020-11-16 ENCOUNTER — Emergency Department (HOSPITAL_COMMUNITY)
Admission: EM | Admit: 2020-11-16 | Discharge: 2020-11-16 | Disposition: A | Discharge status: Home / Self Care | Payer: Self-pay | Attending: Emergency Medicine | Admitting: Emergency Medicine

## 2020-11-16 ENCOUNTER — Emergency Department (HOSPITAL_COMMUNITY): Payer: Self-pay

## 2020-11-16 ENCOUNTER — Encounter (HOSPITAL_COMMUNITY): Payer: Self-pay

## 2020-11-16 ENCOUNTER — Other Ambulatory Visit: Payer: Self-pay

## 2020-11-16 DIAGNOSIS — F17210 Nicotine dependence, cigarettes, uncomplicated: Secondary | ICD-10-CM | POA: Insufficient documentation

## 2020-11-16 DIAGNOSIS — J45909 Unspecified asthma, uncomplicated: Secondary | ICD-10-CM | POA: Insufficient documentation

## 2020-11-16 DIAGNOSIS — Z91040 Latex allergy status: Secondary | ICD-10-CM | POA: Insufficient documentation

## 2020-11-16 DIAGNOSIS — I10 Essential (primary) hypertension: Secondary | ICD-10-CM | POA: Insufficient documentation

## 2020-11-16 DIAGNOSIS — M546 Pain in thoracic spine: Secondary | ICD-10-CM | POA: Insufficient documentation

## 2020-11-16 DIAGNOSIS — Z79899 Other long term (current) drug therapy: Secondary | ICD-10-CM | POA: Insufficient documentation

## 2020-11-16 DIAGNOSIS — M545 Low back pain, unspecified: Secondary | ICD-10-CM | POA: Insufficient documentation

## 2020-11-16 MED ORDER — NAPROXEN 500 MG PO TABS: 500.0000 mg | ORAL_TABLET | Freq: Two times a day (BID) | ORAL | 0 refills | Status: DC

## 2020-11-16 MED ORDER — OXYCODONE-ACETAMINOPHEN 5-325 MG PO TABS
2.0000 | ORAL_TABLET | Freq: Once | ORAL | Status: AC
  Administered 2020-11-16: 2 via ORAL
  Filled 2020-11-16: qty 2

## 2020-11-16 MED ORDER — HYDROCODONE-ACETAMINOPHEN 5-325 MG PO TABS: 1.0000 | ORAL_TABLET | Freq: Four times a day (QID) | ORAL | 0 refills | Status: DC | PRN

## 2020-11-16 MED ORDER — KETOROLAC TROMETHAMINE 60 MG/2ML IM SOLN
60.0000 mg | Freq: Once | INTRAMUSCULAR | Status: AC
  Administered 2020-11-16: 60 mg via INTRAMUSCULAR
  Filled 2020-11-16: qty 2

## 2020-11-16 NOTE — ED Triage Notes (Signed)
Pt presents to ED from home with c/o of mid back pain that started 2 days ago when he woke up. Pt denies injury. Pt says he took a tramadol that his Dr had prescribed for lower back pain months ago. Pt says this did not help.

## 2020-11-16 NOTE — ED Provider Notes (Signed)
[facility] EMERGENCY DEPARTMENT Provider Note   CSN: [id number] Arrival date & time: 11/16/20  0321     History Chief Complaint  Patient presents with   Back Pain    Gary Higgins is a 40 y.o. male.  Patient is a 40 year old male with past medical history of ADHD, anxiety, bipolar, hypertension, migraines, PTSD.  Patient presenting today for evaluation of back pain.  He describes severe pain in the center of his back that was present when he woke from sleep 2 mornings ago.  This evening while trying to sleep, the pain was so severe that he could not.  He describes tingling in the toes of both feet, but denies any bowel or bladder complaints.  He denies any weakness.  The history is provided by the patient.  Back Pain Location:  Thoracic spine and lumbar spine Quality:  Stabbing Radiates to:  Does not radiate Pain severity:  Severe Onset quality:  Sudden Duration:  2 days Timing:  Constant Progression:  Worsening Chronicity:  New     Past Medical History:  Diagnosis Date   ADHD    Anxiety    Asthma    occas. use of inhaler    Bipolar disorder (HCC)    Depression    GERD (gastroesophageal reflux disease)    Hyperlipemia    Hypertension    Migraine    Neuromuscular disorder (HCC)    CT compromise- L hand   PTSD (post-traumatic stress disorder)    Sleep apnea 2014   CPAP study- uses CPAP q night, but currently machine is malfunctioning   - done at [facility]    Patient Active Problem List   Diagnosis Date Noted   Polypharmacy 10/28/2017   Migraine 10/27/2017   Chest pain 08/10/2016   Obesity 08/10/2016   Bipolar disorder (HCC) 08/10/2016   H/O cervical discectomy 08/10/2016   HTN (hypertension) 08/10/2016   Tobacco abuse 08/10/2016   ADHD 08/10/2016   Asthma 08/10/2016   OSA (obstructive sleep apnea) 08/10/2016   HNP (herniated nucleus pulposus), cervical 05/17/2014    Past Surgical History:  Procedure Laterality Date   ANTERIOR CERVICAL  DECOMP/DISCECTOMY FUSION N/A 05/17/2014   Procedure: Cervical five-six anterior cervical decompression with fusion plating and bonegraft;  Surgeon: Coletta Memos, MD;  Location: [facility];  Service: Neurosurgery;  Laterality: N/A;  Cervical five-six anterior cervical decompression with fusion plating and bonegraft   CARPAL TUNNEL RELEASE Right    SCROTAL SURGERY     as a child- related to trauma   WISDOM TOOTH EXTRACTION         Family History  Problem Relation Age of Onset   CAD Maternal Grandfather    Diabetes Mother    COPD Father     Social History   Tobacco Use   Smoking status: Every Day    Packs/day: 1.00    Types: Cigarettes   Smokeless tobacco: Never  Vaping Use   Vaping Use: Never used  Substance Use Topics   Alcohol use: No   Drug use: Yes    Types: Marijuana    Comment: Smokes marijuana everyday     Home Medications Prior to Admission medications   Medication Sig Start Date End Date Taking? Authorizing Provider  ALPRAZolam Prudy Feeler) 0.5 MG tablet Take 0.5 mg by mouth 3 (three) times daily as needed.     [provider]  atomoxetine (STRATTERA) 80 MG capsule Take 80 mg by mouth daily.    [provider]  Fremanezumab-vfrm (AJOVY)  225 MG/1.5ML SOSY Inject 225 mg into the skin every 30 (thirty) days. 10/28/17   Levert Feinstein, MD  HYDROcodone-acetaminophen (NORCO/VICODIN) 5-325 MG tablet Take one tab po q 4 hrs prn pain 09/12/20   Triplett, Tammy, PA-C  ibuprofen (ADVIL,MOTRIN) 800 MG tablet Take 800 mg by mouth 3 (three) times daily as needed.    [provider]  lithium carbonate 150 MG capsule Take 150 mg by mouth daily.    [provider]  ondansetron (ZOFRAN ODT) 4 MG disintegrating tablet Take 1 tablet (4 mg total) by mouth every 8 (eight) hours as needed. 10/28/17   Levert Feinstein, MD  penicillin v potassium (VEETID) 500 MG tablet Take 1 tablet (500 mg total) by mouth 4 (four) times daily. 02/03/18   Zadie Rhine, MD  propranolol  ER (INDERAL LA) 120 MG 24 hr capsule Take 120 mg by mouth daily.    [provider]  simvastatin (ZOCOR) 10 MG tablet Take 10 mg by mouth daily.    [provider]  SUMAtriptan (IMITREX) 100 MG tablet Take 1 tablet (100 mg total) by mouth once as needed for up to 1 dose for migraine. May repeat in 2 hours if headache persists or recurs. 10/28/17   Levert Feinstein, MD  traZODone (DESYREL) 50 MG tablet Take 50 mg by mouth at bedtime.    [provider]  venlafaxine XR (EFFEXOR-XR) 150 MG 24 hr capsule Take 150 mg by mouth daily with breakfast.    [provider]    Allergies    Bee venom, Latex, Topamax [topiramate], and Prednisone  Review of Systems   Review of Systems  Musculoskeletal:  Positive for back pain.  All other systems reviewed and are negative.  Physical Exam Updated Vital Signs BP (!) 151/96   Pulse 63   Temp 98.1 F (36.7 C) (Oral)   Resp 18   Ht 6' (1.829 m)   Wt 95.3 kg   SpO2 99%   BMI 28.49 kg/m   Physical Exam Vitals and nursing note reviewed.  Constitutional:      General: He is not in acute distress.    Appearance: He is well-developed. He is not diaphoretic.     Comments: A strong odor of marijuana is present  HENT:     Head: Normocephalic and atraumatic.  Cardiovascular:     Rate and Rhythm: Normal rate and regular rhythm.     Heart sounds: No murmur heard.   No friction rub.  Pulmonary:     Effort: Pulmonary effort is normal. No respiratory distress.     Breath sounds: Normal breath sounds. No wheezing or rales.  Abdominal:     General: Bowel sounds are normal. There is no distension.     Palpations: Abdomen is soft.     Tenderness: There is no abdominal tenderness.  Musculoskeletal:        General: Normal range of motion.     Cervical back: Normal range of motion and neck supple.     Comments: There is tenderness to palpation in the upper lumbar/lower thoracic region.  There is no bony tenderness or step-off.   There is no other palpable or visible abnormality on exam.  Skin:    General: Skin is warm and dry.  Neurological:     Mental Status: He is alert and oriented to person, place, and time.     Coordination: Coordination normal.     Comments: DTRs are 2+ and symmetrical in the patellar and Achilles tendons  bilaterally.  Strength is 5 out of 5 in both lower extremities.    ED Results / Procedures / Treatments   Labs (all labs ordered are listed, but only abnormal results are displayed) Labs Reviewed - No data to display  EKG None  Radiology No results found.  Procedures Procedures   Medications Ordered in ED Medications  ketorolac (TORADOL) injection 60 mg (has no administration in time range)  oxyCODONE-acetaminophen (PERCOCET/ROXICET) 5-325 MG per tablet 2 tablet (has no administration in time range)    ED Course  I have reviewed the triage vital signs and the nursing notes.  Pertinent labs & imaging results that were available during my care of the patient were reviewed by me and considered in my medical decision making (see chart for details).    MDM Rules/Calculators/A&P  Patient presenting here with complaints of severe pain in his mid back that started 2 mornings ago in the absence of any injury or trauma.  He describes numbness to his toes, but reflexes are symmetrical and normal and strength is within normal limits.  He has no bowel or bladder complaints and there are no red flags that would suggest an emergently surgical situation.  CT scan was obtained, also showing no acute process.  His pain is in his mid back so the L5-S1 disc issue identified I suspect is incidental.  At this point, I feel as though discharge is appropriate.  Patient will be started on naproxen and given pain medication.  He is to follow-up with primary doctor if not improving.  Final Clinical Impression(s) / ED Diagnoses Final diagnoses:  None    Rx / DC Orders ED Discharge Orders      None        Geoffery Lyons, MD 11/16/20 [phone]

## 2020-11-16 NOTE — Discharge Instructions (Addendum)
Begin taking naproxen as prescribed.  Begin taking hydrocodone as prescribed as needed for pain unrelieved with naproxen.  Follow-up with primary doctor if your symptoms are not improving in the next 7 days.

## 2020-12-04 ENCOUNTER — Emergency Department (HOSPITAL_COMMUNITY)
Admission: EM | Admit: 2020-12-04 | Discharge: 2020-12-04 | Disposition: A | Discharge status: Home / Self Care | Payer: Medicaid Other | Attending: Emergency Medicine | Admitting: Emergency Medicine

## 2020-12-04 ENCOUNTER — Encounter (HOSPITAL_COMMUNITY): Payer: Self-pay | Admitting: *Deleted

## 2020-12-04 ENCOUNTER — Other Ambulatory Visit: Payer: Self-pay

## 2020-12-04 DIAGNOSIS — J45909 Unspecified asthma, uncomplicated: Secondary | ICD-10-CM | POA: Insufficient documentation

## 2020-12-04 DIAGNOSIS — F17210 Nicotine dependence, cigarettes, uncomplicated: Secondary | ICD-10-CM | POA: Insufficient documentation

## 2020-12-04 DIAGNOSIS — L03317 Cellulitis of buttock: Secondary | ICD-10-CM | POA: Insufficient documentation

## 2020-12-04 DIAGNOSIS — Z91040 Latex allergy status: Secondary | ICD-10-CM | POA: Insufficient documentation

## 2020-12-04 DIAGNOSIS — Z79899 Other long term (current) drug therapy: Secondary | ICD-10-CM | POA: Insufficient documentation

## 2020-12-04 DIAGNOSIS — I10 Essential (primary) hypertension: Secondary | ICD-10-CM | POA: Insufficient documentation

## 2020-12-04 MED ORDER — HYDROCODONE-ACETAMINOPHEN 5-325 MG PO TABS
2.0000 | ORAL_TABLET | Freq: Once | ORAL | Status: AC
Start: 2020-12-04 — End: 2020-12-04
  Administered 2020-12-04: 2 via ORAL
  Filled 2020-12-04: qty 2

## 2020-12-04 MED ORDER — CEFTRIAXONE SODIUM 1 G IJ SOLR
1.0000 g | Freq: Once | INTRAMUSCULAR | Status: AC
  Administered 2020-12-04: 1 g via INTRAMUSCULAR
  Filled 2020-12-04: qty 10

## 2020-12-04 MED ORDER — HYDROCODONE-ACETAMINOPHEN 5-325 MG PO TABS: 1.0000 | ORAL_TABLET | ORAL | 0 refills | Status: AC | PRN

## 2020-12-04 MED ORDER — STERILE WATER FOR INJECTION IJ SOLN
INTRAMUSCULAR | Status: AC
  Administered 2020-12-04: 10 mL
  Filled 2020-12-04: qty 10

## 2020-12-04 MED ORDER — DOXYCYCLINE HYCLATE 100 MG PO CAPS: 100.0000 mg | ORAL_CAPSULE | Freq: Two times a day (BID) | ORAL | 0 refills | Status: DC

## 2020-12-04 NOTE — ED Provider Notes (Signed)
[facility] EMERGENCY DEPARTMENT Provider Note   CSN: [id number] Arrival date & time: 12/04/20  1510     History No chief complaint on file.   Gary Higgins is a 40 y.o. male.  Pt complains of a red swollen area on right buttock.  Pt thinks something may have bitten him   The history is provided by the patient. No language interpreter was used.  Rash Quality: redness and swelling   Severity:  Moderate Onset quality:  Gradual Timing:  Constant     Past Medical History:  Diagnosis Date   ADHD    Anxiety    Asthma    occas. use of inhaler    Bipolar disorder (HCC)    Depression    GERD (gastroesophageal reflux disease)    Hyperlipemia    Hypertension    Migraine    Neuromuscular disorder (HCC)    CT compromise- L hand   PTSD (post-traumatic stress disorder)    Sleep apnea 2014   CPAP study- uses CPAP q night, but currently machine is malfunctioning   - done at [facility]    Patient Active Problem List   Diagnosis Date Noted   Polypharmacy 10/28/2017   Migraine 10/27/2017   Chest pain 08/10/2016   Obesity 08/10/2016   Bipolar disorder (HCC) 08/10/2016   H/O cervical discectomy 08/10/2016   HTN (hypertension) 08/10/2016   Tobacco abuse 08/10/2016   ADHD 08/10/2016   Asthma 08/10/2016   OSA (obstructive sleep apnea) 08/10/2016   HNP (herniated nucleus pulposus), cervical 05/17/2014    Past Surgical History:  Procedure Laterality Date   ANTERIOR CERVICAL DECOMP/DISCECTOMY FUSION N/A 05/17/2014   Procedure: Cervical five-six anterior cervical decompression with fusion plating and bonegraft;  Surgeon: Coletta Memos, MD;  Location: [facility];  Service: Neurosurgery;  Laterality: N/A;  Cervical five-six anterior cervical decompression with fusion plating and bonegraft   CARPAL TUNNEL RELEASE Right    SCROTAL SURGERY     as a child- related to trauma   WISDOM TOOTH EXTRACTION         Family History  Problem Relation Age of Onset   CAD Maternal  Grandfather    Diabetes Mother    COPD Father     Social History   Tobacco Use   Smoking status: Every Day    Packs/day: 1.00    Types: Cigarettes   Smokeless tobacco: Never  Vaping Use   Vaping Use: Never used  Substance Use Topics   Alcohol use: No   Drug use: Yes    Types: Marijuana    Comment: Smokes marijuana everyday     Home Medications Prior to Admission medications   Medication Sig Start Date End Date Taking? Authorizing Provider  doxycycline (VIBRAMYCIN) 100 MG capsule Take 1 capsule (100 mg total) by mouth 2 (two) times daily. 12/04/20  Yes Elson Areas, PA-C  HYDROcodone-acetaminophen (NORCO/VICODIN) 5-325 MG tablet Take 1 tablet by mouth every 4 (four) hours as needed for moderate pain. 12/04/20 12/04/21 Yes Elson Areas, PA-C  ALPRAZolam Prudy Feeler) 0.5 MG tablet Take 0.5 mg by mouth 3 (three) times daily as needed.     [provider]  atomoxetine (STRATTERA) 80 MG capsule Take 80 mg by mouth daily.    [provider]  Fremanezumab-vfrm (AJOVY) 225 MG/1.5ML SOSY Inject 225 mg into the skin every 30 (thirty) days. 10/28/17   Levert Feinstein, MD  ibuprofen (ADVIL,MOTRIN) 800 MG tablet Take 800 mg by mouth 3 (three) times daily as needed.  [provider]  lithium carbonate 150 MG capsule Take 150 mg by mouth daily.    [provider]  naproxen (NAPROSYN) 500 MG tablet Take 1 tablet (500 mg total) by mouth 2 (two) times daily. 11/16/20   Geoffery Lyons, MD  ondansetron (ZOFRAN ODT) 4 MG disintegrating tablet Take 1 tablet (4 mg total) by mouth every 8 (eight) hours as needed. 10/28/17   Levert Feinstein, MD  penicillin v potassium (VEETID) 500 MG tablet Take 1 tablet (500 mg total) by mouth 4 (four) times daily. 02/03/18   Zadie Rhine, MD  propranolol ER (INDERAL LA) 120 MG 24 hr capsule Take 120 mg by mouth daily.    [provider]  simvastatin (ZOCOR) 10 MG tablet Take 10 mg by mouth daily.    [provider]  SUMAtriptan  (IMITREX) 100 MG tablet Take 1 tablet (100 mg total) by mouth once as needed for up to 1 dose for migraine. May repeat in 2 hours if headache persists or recurs. 10/28/17   Levert Feinstein, MD  traZODone (DESYREL) 50 MG tablet Take 50 mg by mouth at bedtime.    [provider]  venlafaxine XR (EFFEXOR-XR) 150 MG 24 hr capsule Take 150 mg by mouth daily with breakfast.    [provider]    Allergies    Bee venom, Latex, Topamax [topiramate], and Prednisone  Review of Systems   Review of Systems  Skin:  Positive for rash.  All other systems reviewed and are negative.  Physical Exam Updated Vital Signs BP 135/76 (BP Location: Right Arm)   Pulse 65   Temp 98.7 F (37.1 C)   Resp 18   Ht 6' (1.829 m)   Wt 96.4 kg   SpO2 98%   BMI 28.82 kg/m   Physical Exam Vitals and nursing note reviewed.  Constitutional:      Appearance: He is well-developed.  HENT:     Head: Normocephalic.  Cardiovascular:     Rate and Rhythm: Normal rate.     Pulses: Normal pulses.  Pulmonary:     Effort: Pulmonary effort is normal.  Abdominal:     General: There is no distension.  Musculoskeletal:        General: Normal range of motion.     Cervical back: Normal range of motion.     Comments: 10 cm area of erythema right upper buttock low back area,  tender to touch.  No fluctuance   Neurological:     Mental Status: He is alert and oriented to person, place, and time.  Psychiatric:        Mood and Affect: Mood normal.    ED Results / Procedures / Treatments   Labs (all labs ordered are listed, but only abnormal results are displayed) Labs Reviewed - No data to display  EKG None  Radiology No results found.  Procedures Procedures   Medications Ordered in ED Medications  cefTRIAXone (ROCEPHIN) injection 1 g (1 g Intramuscular Given 12/04/20 2022)  HYDROcodone-acetaminophen (NORCO/VICODIN) 5-325 MG per tablet 2 tablet (2 tablets Oral Given 12/04/20 2022)  sterile water  (preservative free) injection (10 mLs  Given 12/04/20 2022)    ED Course  I have reviewed the triage vital signs and the nursing notes.  Pertinent labs & imaging results that were available during my care of the patient were reviewed by me and considered in my medical decision making (see chart for details).    MDM Rules/Calculators/A&P  MDM: Pt counseled on warm compresses.  Pt given rocephin here and rx for doxycycline  Final Clinical Impression(s) / ED Diagnoses Final diagnoses:  Cellulitis of buttock    Rx / DC Orders ED Discharge Orders          Ordered    doxycycline (VIBRAMYCIN) 100 MG capsule  2 times daily        12/04/20 2017    HYDROcodone-acetaminophen (NORCO/VICODIN) 5-325 MG tablet  Every 4 hours PRN        12/04/20 2017          An After Visit Summary was printed and given to the patient.    Osie Cheeks 12/04/20 2212    Benjiman Core, MD 12/04/20 [phone]

## 2020-12-04 NOTE — ED Triage Notes (Signed)
Abscess right buttocks

## 2020-12-04 NOTE — Discharge Instructions (Addendum)
Saok area 20 minutes 4 times a day  Return if any problems.

## 2020-12-30 ENCOUNTER — Emergency Department (HOSPITAL_COMMUNITY): Payer: Medicaid Other

## 2020-12-30 ENCOUNTER — Encounter (HOSPITAL_COMMUNITY): Payer: Self-pay | Admitting: Emergency Medicine

## 2020-12-30 ENCOUNTER — Emergency Department (HOSPITAL_COMMUNITY)
Admission: EM | Admit: 2020-12-30 | Discharge: 2020-12-30 | Disposition: A | Discharge status: Home / Self Care | Payer: Medicaid Other | Attending: Emergency Medicine | Admitting: Emergency Medicine

## 2020-12-30 ENCOUNTER — Other Ambulatory Visit: Payer: Self-pay

## 2020-12-30 DIAGNOSIS — Y92009 Unspecified place in unspecified non-institutional (private) residence as the place of occurrence of the external cause: Secondary | ICD-10-CM | POA: Insufficient documentation

## 2020-12-30 DIAGNOSIS — Y9302 Activity, running: Secondary | ICD-10-CM | POA: Insufficient documentation

## 2020-12-30 DIAGNOSIS — T5891XA Toxic effect of carbon monoxide from unspecified source, accidental (unintentional), initial encounter: Secondary | ICD-10-CM | POA: Insufficient documentation

## 2020-12-30 DIAGNOSIS — F17210 Nicotine dependence, cigarettes, uncomplicated: Secondary | ICD-10-CM | POA: Insufficient documentation

## 2020-12-30 DIAGNOSIS — X021XXA Exposure to smoke in controlled fire in building or structure, initial encounter: Secondary | ICD-10-CM | POA: Insufficient documentation

## 2020-12-30 DIAGNOSIS — Z7729 Contact with and (suspected ) exposure to other hazardous substances: Secondary | ICD-10-CM

## 2020-12-30 DIAGNOSIS — I10 Essential (primary) hypertension: Secondary | ICD-10-CM | POA: Insufficient documentation

## 2020-12-30 DIAGNOSIS — Z91040 Latex allergy status: Secondary | ICD-10-CM | POA: Insufficient documentation

## 2020-12-30 DIAGNOSIS — J45909 Unspecified asthma, uncomplicated: Secondary | ICD-10-CM | POA: Insufficient documentation

## 2020-12-30 LAB — CBC WITH DIFFERENTIAL/PLATELET
Abs Immature Granulocytes: 0.06 10*3/uL (ref 0.00–0.07)
Basophils Absolute: 0.1 10*3/uL (ref 0.0–0.1)
Basophils Relative: 0 %
Eosinophils Absolute: 0.8 10*3/uL — ABNORMAL HIGH (ref 0.0–0.5)
Eosinophils Relative: 6 %
HCT: 43.3 % (ref 39.0–52.0)
Hemoglobin: 14.8 g/dL (ref 13.0–17.0)
Immature Granulocytes: 0 %
Lymphocytes Relative: 14 %
Lymphs Abs: 2.1 10*3/uL (ref 0.7–4.0)
MCH: 30.5 pg (ref 26.0–34.0)
MCHC: 34.2 g/dL (ref 30.0–36.0)
MCV: 89.1 fL (ref 80.0–100.0)
Monocytes Absolute: 1 10*3/uL (ref 0.1–1.0)
Monocytes Relative: 7 %
Neutro Abs: 10.7 10*3/uL — ABNORMAL HIGH (ref 1.7–7.7)
Neutrophils Relative %: 73 %
Platelets: 227 10*3/uL (ref 150–400)
RBC: 4.86 MIL/uL (ref 4.22–5.81)
RDW: 12.8 % (ref 11.5–15.5)
WBC: 14.7 10*3/uL — ABNORMAL HIGH (ref 4.0–10.5)
nRBC: 0 % (ref 0.0–0.2)

## 2020-12-30 LAB — BASIC METABOLIC PANEL
Anion gap: 5 (ref 5–15)
BUN: 10 mg/dL (ref 6–20)
CO2: 27 mmol/L (ref 22–32)
Calcium: 9.1 mg/dL (ref 8.9–10.3)
Chloride: 105 mmol/L (ref 98–111)
Creatinine, Ser: 0.88 mg/dL (ref 0.61–1.24)
GFR, Estimated: >60 mL/min (ref >60)
Glucose, Bld: 102 mg/dL — ABNORMAL HIGH (ref 70–99)
Potassium: 3.8 mmol/L (ref 3.5–5.1)
Sodium: 137 mmol/L (ref 135–145)

## 2020-12-30 LAB — BLOOD GAS, ARTERIAL
Acid-Base Excess: 0.8 mmol/L (ref 0.0–2.0)
Bicarbonate: 24.8 mmol/L (ref 20.0–28.0)
FIO2: 40
O2 Saturation: 97.8 %
Patient temperature: 37.1
pCO2 arterial: 45 mmHg (ref 32.0–48.0)
pH, Arterial: 7.371 (ref 7.350–7.450)
pO2, Arterial: 104 mmHg (ref 83.0–108.0)

## 2020-12-30 MED ORDER — ALBUTEROL SULFATE HFA 108 (90 BASE) MCG/ACT IN AERS
2.0000 | INHALATION_SPRAY | RESPIRATORY_TRACT | Status: DC | PRN
  Administered 2020-12-30: 2 via RESPIRATORY_TRACT
  Filled 2020-12-30: qty 6.7

## 2020-12-30 NOTE — ED Notes (Signed)
Coox thb , thb 14.1, cto2 18.4, so2 97.8, fCOHB 5.2 .

## 2020-12-30 NOTE — Discharge Instructions (Addendum)
Return to the emergency department if you develop any new and/or concerning symptoms. °
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
## 2021-04-09 DIAGNOSIS — J0101 Acute recurrent maxillary sinusitis: Secondary | ICD-10-CM | POA: Diagnosis not present

## 2021-04-09 DIAGNOSIS — Z6831 Body mass index (BMI) 31.0-31.9, adult: Secondary | ICD-10-CM | POA: Diagnosis not present

## 2021-04-09 DIAGNOSIS — R69 Illness, unspecified: Secondary | ICD-10-CM | POA: Diagnosis not present

## 2021-04-26 DIAGNOSIS — R69 Illness, unspecified: Secondary | ICD-10-CM | POA: Diagnosis not present

## 2021-05-02 DIAGNOSIS — R69 Illness, unspecified: Secondary | ICD-10-CM | POA: Diagnosis not present

## 2021-05-31 DIAGNOSIS — F1320 Sedative, hypnotic or anxiolytic dependence, uncomplicated: Secondary | ICD-10-CM | POA: Diagnosis not present

## 2021-05-31 DIAGNOSIS — E039 Hypothyroidism, unspecified: Secondary | ICD-10-CM | POA: Diagnosis not present

## 2021-05-31 DIAGNOSIS — Z79899 Other long term (current) drug therapy: Secondary | ICD-10-CM | POA: Diagnosis not present

## 2021-05-31 DIAGNOSIS — Z131 Encounter for screening for diabetes mellitus: Secondary | ICD-10-CM | POA: Diagnosis not present

## 2021-05-31 DIAGNOSIS — Z5181 Encounter for therapeutic drug level monitoring: Secondary | ICD-10-CM | POA: Diagnosis not present

## 2021-05-31 DIAGNOSIS — E785 Hyperlipidemia, unspecified: Secondary | ICD-10-CM | POA: Diagnosis not present

## 2021-06-12 DIAGNOSIS — I10 Essential (primary) hypertension: Secondary | ICD-10-CM | POA: Diagnosis not present

## 2021-06-12 DIAGNOSIS — Z6832 Body mass index (BMI) 32.0-32.9, adult: Secondary | ICD-10-CM | POA: Diagnosis not present

## 2021-06-12 DIAGNOSIS — R739 Hyperglycemia, unspecified: Secondary | ICD-10-CM | POA: Diagnosis not present

## 2021-06-12 DIAGNOSIS — R69 Illness, unspecified: Secondary | ICD-10-CM | POA: Diagnosis not present

## 2021-06-12 DIAGNOSIS — G43909 Migraine, unspecified, not intractable, without status migrainosus: Secondary | ICD-10-CM | POA: Diagnosis not present

## 2021-06-12 DIAGNOSIS — J209 Acute bronchitis, unspecified: Secondary | ICD-10-CM | POA: Diagnosis not present

## 2021-06-12 DIAGNOSIS — E782 Mixed hyperlipidemia: Secondary | ICD-10-CM | POA: Diagnosis not present

## 2021-06-12 DIAGNOSIS — J0101 Acute recurrent maxillary sinusitis: Secondary | ICD-10-CM | POA: Diagnosis not present

## 2021-06-12 DIAGNOSIS — M542 Cervicalgia: Secondary | ICD-10-CM | POA: Diagnosis not present

## 2021-06-21 DIAGNOSIS — F1320 Sedative, hypnotic or anxiolytic dependence, uncomplicated: Secondary | ICD-10-CM | POA: Diagnosis not present

## 2021-06-21 DIAGNOSIS — Z5181 Encounter for therapeutic drug level monitoring: Secondary | ICD-10-CM | POA: Diagnosis not present

## 2021-06-21 DIAGNOSIS — E039 Hypothyroidism, unspecified: Secondary | ICD-10-CM | POA: Diagnosis not present

## 2021-06-21 DIAGNOSIS — Z79899 Other long term (current) drug therapy: Secondary | ICD-10-CM | POA: Diagnosis not present

## 2021-06-28 DIAGNOSIS — F1320 Sedative, hypnotic or anxiolytic dependence, uncomplicated: Secondary | ICD-10-CM | POA: Diagnosis not present

## 2021-06-28 DIAGNOSIS — F1124 Opioid dependence with opioid-induced mood disorder: Secondary | ICD-10-CM | POA: Diagnosis not present

## 2021-06-28 DIAGNOSIS — Z79899 Other long term (current) drug therapy: Secondary | ICD-10-CM | POA: Diagnosis not present

## 2021-06-28 DIAGNOSIS — Z5181 Encounter for therapeutic drug level monitoring: Secondary | ICD-10-CM | POA: Diagnosis not present

## 2021-07-26 DIAGNOSIS — F411 Generalized anxiety disorder: Secondary | ICD-10-CM | POA: Diagnosis not present

## 2021-07-26 DIAGNOSIS — F4312 Post-traumatic stress disorder, chronic: Secondary | ICD-10-CM | POA: Diagnosis not present

## 2021-07-26 DIAGNOSIS — R69 Illness, unspecified: Secondary | ICD-10-CM | POA: Diagnosis not present

## 2021-07-26 DIAGNOSIS — Z5181 Encounter for therapeutic drug level monitoring: Secondary | ICD-10-CM | POA: Diagnosis not present

## 2021-07-26 DIAGNOSIS — Z79899 Other long term (current) drug therapy: Secondary | ICD-10-CM | POA: Diagnosis not present

## 2021-07-26 DIAGNOSIS — F1320 Sedative, hypnotic or anxiolytic dependence, uncomplicated: Secondary | ICD-10-CM | POA: Diagnosis not present

## 2021-08-09 DIAGNOSIS — F411 Generalized anxiety disorder: Secondary | ICD-10-CM | POA: Diagnosis not present

## 2021-08-09 DIAGNOSIS — R69 Illness, unspecified: Secondary | ICD-10-CM | POA: Diagnosis not present

## 2021-08-09 DIAGNOSIS — F4312 Post-traumatic stress disorder, chronic: Secondary | ICD-10-CM | POA: Diagnosis not present

## 2021-08-09 DIAGNOSIS — F1320 Sedative, hypnotic or anxiolytic dependence, uncomplicated: Secondary | ICD-10-CM | POA: Diagnosis not present

## 2021-08-09 DIAGNOSIS — Z79899 Other long term (current) drug therapy: Secondary | ICD-10-CM | POA: Diagnosis not present

## 2021-08-09 DIAGNOSIS — Z5181 Encounter for therapeutic drug level monitoring: Secondary | ICD-10-CM | POA: Diagnosis not present

## 2021-09-05 DIAGNOSIS — Z20828 Contact with and (suspected) exposure to other viral communicable diseases: Secondary | ICD-10-CM | POA: Diagnosis not present

## 2021-09-05 DIAGNOSIS — Z6832 Body mass index (BMI) 32.0-32.9, adult: Secondary | ICD-10-CM | POA: Diagnosis not present

## 2021-09-05 DIAGNOSIS — R69 Illness, unspecified: Secondary | ICD-10-CM | POA: Diagnosis not present

## 2021-09-05 DIAGNOSIS — R059 Cough, unspecified: Secondary | ICD-10-CM | POA: Diagnosis not present

## 2021-09-05 DIAGNOSIS — J40 Bronchitis, not specified as acute or chronic: Secondary | ICD-10-CM | POA: Diagnosis not present

## 2021-10-04 DIAGNOSIS — Z79899 Other long term (current) drug therapy: Secondary | ICD-10-CM | POA: Diagnosis not present

## 2021-10-04 DIAGNOSIS — Z79891 Long term (current) use of opiate analgesic: Secondary | ICD-10-CM | POA: Diagnosis not present

## 2021-10-22 DIAGNOSIS — L03113 Cellulitis of right upper limb: Secondary | ICD-10-CM | POA: Diagnosis not present

## 2021-10-22 DIAGNOSIS — R030 Elevated blood-pressure reading, without diagnosis of hypertension: Secondary | ICD-10-CM | POA: Diagnosis not present

## 2021-10-22 DIAGNOSIS — R69 Illness, unspecified: Secondary | ICD-10-CM | POA: Diagnosis not present

## 2021-10-27 ENCOUNTER — Emergency Department (HOSPITAL_COMMUNITY)
Admission: EM | Admit: 2021-10-27 | Discharge: 2021-10-28 | Disposition: A | Discharge status: Home / Self Care | Payer: 59 | Attending: Emergency Medicine | Admitting: Emergency Medicine

## 2021-10-27 ENCOUNTER — Encounter (HOSPITAL_COMMUNITY): Payer: Self-pay

## 2021-10-27 ENCOUNTER — Other Ambulatory Visit: Payer: Self-pay

## 2021-10-27 DIAGNOSIS — D72829 Elevated white blood cell count, unspecified: Secondary | ICD-10-CM | POA: Insufficient documentation

## 2021-10-27 DIAGNOSIS — I10 Essential (primary) hypertension: Secondary | ICD-10-CM | POA: Diagnosis not present

## 2021-10-27 DIAGNOSIS — R69 Illness, unspecified: Secondary | ICD-10-CM | POA: Diagnosis not present

## 2021-10-27 DIAGNOSIS — R454 Irritability and anger: Secondary | ICD-10-CM | POA: Insufficient documentation

## 2021-10-27 DIAGNOSIS — K029 Dental caries, unspecified: Secondary | ICD-10-CM | POA: Diagnosis not present

## 2021-10-27 DIAGNOSIS — Y900 Blood alcohol level of less than 20 mg/100 ml: Secondary | ICD-10-CM | POA: Diagnosis not present

## 2021-10-27 DIAGNOSIS — D582 Other hemoglobinopathies: Secondary | ICD-10-CM | POA: Diagnosis not present

## 2021-10-27 DIAGNOSIS — R52 Pain, unspecified: Secondary | ICD-10-CM | POA: Diagnosis not present

## 2021-10-27 DIAGNOSIS — R451 Restlessness and agitation: Secondary | ICD-10-CM | POA: Insufficient documentation

## 2021-10-27 DIAGNOSIS — F4310 Post-traumatic stress disorder, unspecified: Secondary | ICD-10-CM | POA: Insufficient documentation

## 2021-10-27 DIAGNOSIS — R45850 Homicidal ideations: Secondary | ICD-10-CM | POA: Diagnosis not present

## 2021-10-27 DIAGNOSIS — R000 Tachycardia, unspecified: Secondary | ICD-10-CM | POA: Diagnosis not present

## 2021-10-27 DIAGNOSIS — Z043 Encounter for examination and observation following other accident: Secondary | ICD-10-CM | POA: Diagnosis not present

## 2021-10-27 LAB — CBC WITH DIFFERENTIAL/PLATELET
Abs Immature Granulocytes: 0.08 10*3/uL — ABNORMAL HIGH (ref 0.00–0.07)
Basophils Absolute: 0.1 10*3/uL (ref 0.0–0.1)
Basophils Relative: 1 %
Eosinophils Absolute: 0.2 10*3/uL (ref 0.0–0.5)
Eosinophils Relative: 2 %
HCT: 53.5 % — ABNORMAL HIGH (ref 39.0–52.0)
Hemoglobin: 17.9 g/dL — ABNORMAL HIGH (ref 13.0–17.0)
Immature Granulocytes: 1 %
Lymphocytes Relative: 17 %
Lymphs Abs: 2.6 10*3/uL (ref 0.7–4.0)
MCH: 29.8 pg (ref 26.0–34.0)
MCHC: 33.5 g/dL (ref 30.0–36.0)
MCV: 89 fL (ref 80.0–100.0)
Monocytes Absolute: 1.7 10*3/uL — ABNORMAL HIGH (ref 0.1–1.0)
Monocytes Relative: 11 %
Neutro Abs: 10.8 10*3/uL — ABNORMAL HIGH (ref 1.7–7.7)
Neutrophils Relative %: 68 %
Platelets: 260 10*3/uL (ref 150–400)
RBC: 6.01 MIL/uL — ABNORMAL HIGH (ref 4.22–5.81)
RDW: 12.5 % (ref 11.5–15.5)
WBC: 15.5 10*3/uL — ABNORMAL HIGH (ref 4.0–10.5)
nRBC: 0 % (ref 0.0–0.2)

## 2021-10-27 LAB — COMPREHENSIVE METABOLIC PANEL
ALT: 45 U/L — ABNORMAL HIGH (ref 0–44)
AST: 23 U/L (ref 15–41)
Albumin: 4.5 g/dL (ref 3.5–5.0)
Alkaline Phosphatase: 90 U/L (ref 38–126)
Anion gap: 8 (ref 5–15)
BUN: 6 mg/dL (ref 6–20)
CO2: 25 mmol/L (ref 22–32)
Calcium: 9.5 mg/dL (ref 8.9–10.3)
Chloride: 108 mmol/L (ref 98–111)
Creatinine, Ser: 0.91 mg/dL (ref 0.61–1.24)
GFR, Estimated: >60 mL/min (ref >60)
Glucose, Bld: 90 mg/dL (ref 70–99)
Potassium: 3 mmol/L — ABNORMAL LOW (ref 3.5–5.1)
Sodium: 141 mmol/L (ref 135–145)
Total Bilirubin: 0.7 mg/dL (ref 0.3–1.2)
Total Protein: 7.7 g/dL (ref 6.5–8.1)

## 2021-10-27 LAB — ETHANOL: Alcohol, Ethyl (B): <10 mg/dL (ref <10)

## 2021-10-27 LAB — RAPID URINE DRUG SCREEN, HOSP PERFORMED
Amphetamines: NOT DETECTED
Barbiturates: NOT DETECTED
Benzodiazepines: NOT DETECTED
Cocaine: NOT DETECTED
Opiates: NOT DETECTED
Tetrahydrocannabinol: POSITIVE — AB

## 2021-10-27 MED ORDER — QUETIAPINE FUMARATE 25 MG PO TABS
50.0000 mg | ORAL_TABLET | Freq: Every day | ORAL | Status: DC
  Administered 2021-10-27: 50 mg via ORAL
  Filled 2021-10-27: qty 2

## 2021-10-27 MED ORDER — IBUPROFEN 800 MG PO TABS
800.0000 mg | ORAL_TABLET | Freq: Three times a day (TID) | ORAL | Status: DC | PRN
  Administered 2021-10-28: 800 mg via ORAL
  Filled 2021-10-27: qty 1

## 2021-10-27 MED ORDER — PENICILLIN V POTASSIUM 250 MG PO TABS
500.0000 mg | ORAL_TABLET | Freq: Four times a day (QID) | ORAL | Status: DC
  Administered 2021-10-27 – 2021-10-28 (×3): 500 mg via ORAL
  Filled 2021-10-27 (×3): qty 2

## 2021-10-27 MED ORDER — SIMVASTATIN 10 MG PO TABS: 40.0000 mg | ORAL_TABLET | Freq: Every day | ORAL | Status: DC

## 2021-10-27 MED ORDER — VENLAFAXINE HCL ER 37.5 MG PO CP24
150.0000 mg | ORAL_CAPSULE | Freq: Every day | ORAL | Status: DC
  Administered 2021-10-27 – 2021-10-28 (×2): 150 mg via ORAL
  Filled 2021-10-27 (×2): qty 4

## 2021-10-27 MED ORDER — HYDROCODONE-ACETAMINOPHEN 5-325 MG PO TABS
1.0000 | ORAL_TABLET | Freq: Once | ORAL | Status: AC
  Administered 2021-10-27: 1 via ORAL
  Filled 2021-10-27: qty 1

## 2021-10-27 MED ORDER — ALPRAZOLAM 0.5 MG PO TABS
0.5000 mg | ORAL_TABLET | Freq: Three times a day (TID) | ORAL | Status: DC | PRN
  Administered 2021-10-27: 0.5 mg via ORAL
  Filled 2021-10-27: qty 1

## 2021-10-27 MED ORDER — LITHIUM CARBONATE 150 MG PO CAPS
150.0000 mg | ORAL_CAPSULE | Freq: Two times a day (BID) | ORAL | Status: DC
  Administered 2021-10-27 – 2021-10-28 (×2): 150 mg via ORAL
  Filled 2021-10-27 (×2): qty 1

## 2021-10-27 MED ORDER — TOPIRAMATE 25 MG PO TABS
50.0000 mg | ORAL_TABLET | Freq: Every day | ORAL | Status: DC
  Administered 2021-10-27: 50 mg via ORAL
  Filled 2021-10-27: qty 2

## 2021-10-27 MED ORDER — AMLODIPINE BESYLATE 5 MG PO TABS
5.0000 mg | ORAL_TABLET | Freq: Once | ORAL | Status: AC
  Administered 2021-10-27: 5 mg via ORAL
  Filled 2021-10-27: qty 1

## 2021-10-27 MED ORDER — ARIPIPRAZOLE 2 MG PO TABS
2.0000 mg | ORAL_TABLET | Freq: Every day | ORAL | Status: DC
  Administered 2021-10-27: 2 mg via ORAL
  Filled 2021-10-27 (×4): qty 1

## 2021-10-27 MED ORDER — PROPRANOLOL HCL ER 120 MG PO CP24
120.0000 mg | ORAL_CAPSULE | Freq: Every day | ORAL | Status: DC
  Administered 2021-10-27: 120 mg via ORAL
  Filled 2021-10-27 (×3): qty 1

## 2021-10-27 MED ORDER — ATORVASTATIN CALCIUM 10 MG PO TABS
20.0000 mg | ORAL_TABLET | Freq: Every day | ORAL | Status: DC
  Administered 2021-10-27: 20 mg via ORAL
  Filled 2021-10-27: qty 2
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
## 2021-10-27 NOTE — ED Notes (Signed)
Pt changed into burgundy scrubs and pt belongings placed into locker (shirt, shorts, shoes) and pt bag labeled.
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
## 2021-10-27 NOTE — ED Notes (Signed)
Pt ambulatory to bathroom at this time No assistance needed No additional needs at this time Sitter in view and will continue to monitor

## 2021-10-27 NOTE — ED Notes (Signed)
Pt speaking with TTS at this time.  

## 2021-10-28 DIAGNOSIS — R69 Illness, unspecified: Secondary | ICD-10-CM | POA: Diagnosis not present

## 2021-10-28 DIAGNOSIS — R454 Irritability and anger: Secondary | ICD-10-CM

## 2021-10-28 LAB — BASIC METABOLIC PANEL
Anion gap: 8 (ref 5–15)
BUN: 11 mg/dL (ref 6–20)
CO2: 24 mmol/L (ref 22–32)
Calcium: 9.1 mg/dL (ref 8.9–10.3)
Chloride: 108 mmol/L (ref 98–111)
Creatinine, Ser: 0.72 mg/dL (ref 0.61–1.24)
GFR, Estimated: >60 mL/min (ref >60)
Glucose, Bld: 105 mg/dL — ABNORMAL HIGH (ref 70–99)
Potassium: 3.6 mmol/L (ref 3.5–5.1)
Sodium: 140 mmol/L (ref 135–145)

## 2021-10-28 LAB — CBG MONITORING, ED: Glucose-Capillary: 137 mg/dL — ABNORMAL HIGH (ref 70–99)

## 2021-10-28 LAB — LITHIUM LEVEL: Lithium Lvl: 0.2 mmol/L — ABNORMAL LOW (ref 0.60–1.20)

## 2021-10-28 MED ORDER — SODIUM CHLORIDE 0.9 % IV BOLUS
500.0000 mL | Freq: Once | INTRAVENOUS | Status: AC
  Administered 2021-10-28: 500 mL via INTRAVENOUS

## 2021-10-28 MED ORDER — POTASSIUM CHLORIDE CRYS ER 20 MEQ PO TBCR
40.0000 meq | EXTENDED_RELEASE_TABLET | Freq: Once | ORAL | Status: AC
  Administered 2021-10-28: 40 meq via ORAL
  Filled 2021-10-28: qty 2

## 2021-10-28 MED ORDER — LITHIUM CARBONATE 150 MG PO CAPS
150.0000 mg | ORAL_CAPSULE | Freq: Once | ORAL | Status: AC
  Administered 2021-10-28: 150 mg via ORAL
  Filled 2021-10-28: qty 1

## 2021-10-28 MED ORDER — LABETALOL HCL 5 MG/ML IV SOLN
20.0000 mg | Freq: Once | INTRAVENOUS | Status: AC
  Administered 2021-10-28: 20 mg via INTRAVENOUS
  Filled 2021-10-28: qty 4

## 2021-10-28 MED ORDER — METOCLOPRAMIDE HCL 5 MG/ML IJ SOLN
10.0000 mg | Freq: Once | INTRAMUSCULAR | Status: AC
  Administered 2021-10-28: 10 mg via INTRAMUSCULAR
  Filled 2021-10-28: qty 2

## 2021-10-28 MED ORDER — LITHIUM CARBONATE 150 MG PO CAPS
300.0000 mg | ORAL_CAPSULE | Freq: Two times a day (BID) | ORAL | Status: DC
Start: 2021-10-28 — End: 2021-10-28
  Filled 2021-10-28: qty 2

## 2021-10-28 MED ORDER — LITHIUM CARBONATE 150 MG PO CAPS: 300.0000 mg | ORAL_CAPSULE | Freq: Two times a day (BID) | ORAL | Status: DC

## 2021-10-28 NOTE — ED Notes (Signed)
Informed pharmacy patient received 150 mg of Lithium this morning. Patient scheduled to have 300 mg at 12:30. Requesting pharmacy to re-evaluate dosage.

## 2021-10-28 NOTE — ED Notes (Signed)
Personal belonging given back to patient. Patient is psychiatrically cleared. Patient will be currently discharged

## 2021-10-28 NOTE — ED Provider Notes (Signed)
Asked to see patient by nursing staff.  Patient developed a headache earlier, was given ibuprofen.  Sometime after that he became acutely ill with nausea and vomiting.  Denies abdominal pain.  On recheck he has a benign abdominal exam.  Normal neuro exam.  Provided Reglan for his nausea and see if it helps with his headache.  Vitals check reveals he is afebrile but has been persistently hypertensive since arrival.  Patient denies alcohol intake, denies any possibility of withdrawal from any substances.     Gilda Crease, MD 10/28/21 [phone]

## 2021-10-28 NOTE — Discharge Instructions (Signed)
Follow-up as instructed by behavioral health 

## 2021-10-28 NOTE — ED Notes (Signed)
Pt provided 800 mg Ibuprofen PO after Pt reported he had a headache. Pt remains calm and cooperative at this time.

## 2021-10-28 NOTE — ED Notes (Signed)
Pt walked to the BR w/o any difficulty  

## 2021-10-28 NOTE — ED Notes (Signed)
Pt speaking to wife on the phone at this time
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
## 2021-10-28 NOTE — ED Notes (Signed)
Per MD ok to remove pts IV/ and cardiac monitor.   Pt sitting up in bed at this time in no distress. VSS. Afebrile. Will continue to monitor pt.

## 2021-10-29 ENCOUNTER — Encounter (HOSPITAL_COMMUNITY): Payer: Self-pay | Admitting: Emergency Medicine

## 2021-11-13 DIAGNOSIS — R69 Illness, unspecified: Secondary | ICD-10-CM | POA: Diagnosis not present

## 2021-11-13 DIAGNOSIS — Z6833 Body mass index (BMI) 33.0-33.9, adult: Secondary | ICD-10-CM | POA: Diagnosis not present

## 2021-11-13 DIAGNOSIS — M545 Low back pain, unspecified: Secondary | ICD-10-CM | POA: Diagnosis not present

## 2021-11-13 DIAGNOSIS — R739 Hyperglycemia, unspecified: Secondary | ICD-10-CM | POA: Diagnosis not present

## 2021-11-13 DIAGNOSIS — I10 Essential (primary) hypertension: Secondary | ICD-10-CM | POA: Diagnosis not present

## 2021-11-13 DIAGNOSIS — E7849 Other hyperlipidemia: Secondary | ICD-10-CM | POA: Diagnosis not present

## 2021-11-23 ENCOUNTER — Other Ambulatory Visit: Payer: Self-pay

## 2021-11-23 ENCOUNTER — Emergency Department (HOSPITAL_COMMUNITY)
Admission: EM | Admit: 2021-11-23 | Discharge: 2021-11-23 | Disposition: A | Discharge status: Home / Self Care | Payer: 59 | Attending: Emergency Medicine | Admitting: Emergency Medicine

## 2021-11-23 ENCOUNTER — Emergency Department (HOSPITAL_COMMUNITY): Payer: 59

## 2021-11-23 ENCOUNTER — Encounter (HOSPITAL_COMMUNITY): Payer: Self-pay

## 2021-11-23 DIAGNOSIS — K0889 Other specified disorders of teeth and supporting structures: Secondary | ICD-10-CM | POA: Insufficient documentation

## 2021-11-23 DIAGNOSIS — Z79899 Other long term (current) drug therapy: Secondary | ICD-10-CM | POA: Insufficient documentation

## 2021-11-23 DIAGNOSIS — Z91040 Latex allergy status: Secondary | ICD-10-CM | POA: Insufficient documentation

## 2021-11-23 DIAGNOSIS — K029 Dental caries, unspecified: Secondary | ICD-10-CM | POA: Diagnosis not present

## 2021-11-23 LAB — CBC WITH DIFFERENTIAL/PLATELET
Abs Immature Granulocytes: 0.12 10*3/uL — ABNORMAL HIGH (ref 0.00–0.07)
Basophils Absolute: 0.1 10*3/uL (ref 0.0–0.1)
Basophils Relative: 0 %
Eosinophils Absolute: 0.4 10*3/uL (ref 0.0–0.5)
Eosinophils Relative: 2 %
HCT: 50.6 % (ref 39.0–52.0)
Hemoglobin: 17.2 g/dL — ABNORMAL HIGH (ref 13.0–17.0)
Immature Granulocytes: 1 %
Lymphocytes Relative: 18 %
Lymphs Abs: 3.1 10*3/uL (ref 0.7–4.0)
MCH: 29.5 pg (ref 26.0–34.0)
MCHC: 34 g/dL (ref 30.0–36.0)
MCV: 86.8 fL (ref 80.0–100.0)
Monocytes Absolute: 2 10*3/uL — ABNORMAL HIGH (ref 0.1–1.0)
Monocytes Relative: 12 %
Neutro Abs: 11.6 10*3/uL — ABNORMAL HIGH (ref 1.7–7.7)
Neutrophils Relative %: 67 %
Platelets: 282 10*3/uL (ref 150–400)
RBC: 5.83 MIL/uL — ABNORMAL HIGH (ref 4.22–5.81)
RDW: 12.6 % (ref 11.5–15.5)
WBC: 17.2 10*3/uL — ABNORMAL HIGH (ref 4.0–10.5)
nRBC: 0 % (ref 0.0–0.2)

## 2021-11-23 LAB — BASIC METABOLIC PANEL
Anion gap: 9 (ref 5–15)
BUN: 5 mg/dL — ABNORMAL LOW (ref 6–20)
CO2: 29 mmol/L (ref 22–32)
Calcium: 9.5 mg/dL (ref 8.9–10.3)
Chloride: 96 mmol/L — ABNORMAL LOW (ref 98–111)
Creatinine, Ser: 0.88 mg/dL (ref 0.61–1.24)
GFR, Estimated: >60 mL/min (ref >60)
Glucose, Bld: 95 mg/dL (ref 70–99)
Potassium: 3.3 mmol/L — ABNORMAL LOW (ref 3.5–5.1)
Sodium: 134 mmol/L — ABNORMAL LOW (ref 135–145)

## 2021-11-23 MED ORDER — IBUPROFEN 600 MG PO TABS: 600.0000 mg | ORAL_TABLET | Freq: Four times a day (QID) | ORAL | 0 refills | Status: AC | PRN

## 2021-11-23 MED ORDER — SODIUM CHLORIDE 0.9 % IV SOLN
3.0000 g | Freq: Four times a day (QID) | INTRAVENOUS | Status: DC
  Administered 2021-11-23: 3 g via INTRAVENOUS
  Filled 2021-11-23: qty 8

## 2021-11-23 MED ORDER — IOHEXOL 300 MG/ML  SOLN
75.0000 mL | Freq: Once | INTRAMUSCULAR | Status: AC | PRN
  Administered 2021-11-23: 75 mL via INTRAVENOUS

## 2021-11-23 MED ORDER — MORPHINE SULFATE (PF) 4 MG/ML IV SOLN
4.0000 mg | Freq: Once | INTRAVENOUS | Status: AC
  Administered 2021-11-23: 4 mg via INTRAVENOUS
  Filled 2021-11-23: qty 1

## 2021-11-23 MED ORDER — LIDOCAINE VISCOUS HCL 2 % MT SOLN
15.0000 mL | Freq: Once | OROMUCOSAL | Status: AC
  Administered 2021-11-23: 15 mL via OROMUCOSAL
  Filled 2021-11-23: qty 15

## 2021-11-23 MED ORDER — FENTANYL CITRATE PF 50 MCG/ML IJ SOSY
50.0000 ug | PREFILLED_SYRINGE | Freq: Once | INTRAMUSCULAR | Status: AC
  Administered 2021-11-23: 50 ug via INTRAVENOUS
  Filled 2021-11-23: qty 1

## 2021-11-23 MED ORDER — LIDOCAINE VISCOUS HCL 2 % MT SOLN: 15.0000 mL | OROMUCOSAL | 0 refills | Status: AC | PRN

## 2021-11-23 NOTE — Discharge Instructions (Signed)
Please take 600 mg ibuprofen every 6 hours as needed for pain.  At the 3 to 4-hour mark you can add 650 mg of Tylenol.  You can then take another dose of Tylenol 4 to 6 hours later.  Do not take medication early.  Continue taking Augmentin.  You may also use the Xylocaine Viscous solution for swish and spit.  Please call around to get in to see a dentist this week.  Return to the emergency department for any worsening symptoms you might have.

## 2021-11-23 NOTE — ED Triage Notes (Addendum)
Pt c/o L lower dental pain x6 days.  Broken tooth w/ swollen surrounding gum noted.  Pt reports using clove oil "kill the nerve."  Pt reports "knot" along bottom jaw line.   Pt reports "a little vomiting due to the pain."  Sts my potassium might be low because I've been "catching cramps."

## 2021-11-23 NOTE — ED Provider Notes (Signed)
[facility] EMERGENCY DEPARTMENT Provider Note   CSN: [id number] Arrival date & time: 11/23/21  1754     History Chief Complaint  Patient presents with   Dental Pain    Gary Higgins is a 41 y.o. male patient who presents to the emergency department with left lower dental pain its been ongoing for 6 days.  Patient states he has a broken tooth that has become infected.  He does not have any dental insurance or a dentist.  He has been taking some doxycycline which she had at the house.  He went to urgent care yesterday and got some Augmentin.  He took 1 dose.  He reports having a hard time opening his mouth and talking as his speech little more difficult to get out secondary to the swelling.  He denies any fever or chills.  Pain is a 10/10 in severity.   Dental Pain      Home Medications Prior to Admission medications   Medication Sig Start Date End Date Taking? Authorizing Provider  ibuprofen (ADVIL) 600 MG tablet Take 1 tablet (600 mg total) by mouth every 6 (six) hours as needed. 11/23/21  Yes Meredeth Ide, Cristela Stalder M, PA-C  lidocaine (XYLOCAINE) 2 % solution Use as directed 15 mLs in the mouth or throat as needed for mouth pain. 11/23/21  Yes Jacqualynn Parco M, PA-C  ALPRAZolam Prudy Feeler) 0.5 MG tablet Take 0.5 mg by mouth 3 (three) times daily as needed.     [provider]  ALPRAZolam Prudy Feeler) 0.5 MG tablet Take 0.5 mg by mouth 3 (three) times daily as needed. 10/06/21   [provider]  ARIPiprazole (ABILIFY) 2 MG tablet Take 2 mg by mouth at bedtime. 09/24/21   [provider]  atomoxetine (STRATTERA) 80 MG capsule Take 80 mg by mouth daily.    [provider]  doxycycline (VIBRA-TABS) 100 MG tablet Take 100 mg by mouth 2 (two) times daily. 10/22/21   [provider]  doxycycline (VIBRAMYCIN) 100 MG capsule Take 1 capsule (100 mg total) by mouth 2 (two) times daily. 12/04/20   Elson Areas, PA-C  Fremanezumab-vfrm (AJOVY) 225 MG/1.5ML SOSY Inject  225 mg into the skin every 30 (thirty) days. 10/28/17   Levert Feinstein, MD  HYDROcodone-acetaminophen (NORCO/VICODIN) 5-325 MG tablet Take 1 tablet by mouth every 4 (four) hours as needed for moderate pain. 12/04/20 12/04/21  Elson Areas, PA-C  lithium carbonate 150 MG capsule Take 150 mg by mouth daily.    [provider]  lithium carbonate 150 MG capsule Take 150 mg by mouth 2 (two) times daily. 10/19/21   [provider]  naproxen (NAPROSYN) 500 MG tablet Take 1 tablet (500 mg total) by mouth 2 (two) times daily. 11/16/20   Geoffery Lyons, MD  ondansetron (ZOFRAN ODT) 4 MG disintegrating tablet Take 1 tablet (4 mg total) by mouth every 8 (eight) hours as needed. 10/28/17   Levert Feinstein, MD  penicillin v potassium (VEETID) 500 MG tablet Take 1 tablet (500 mg total) by mouth 4 (four) times daily. 02/03/18   Zadie Rhine, MD  propranolol ER (INDERAL LA) 120 MG 24 hr capsule Take 120 mg by mouth daily.    [provider]  propranolol ER (INDERAL LA) 120 MG 24 hr capsule Take 120 mg by mouth daily. 09/24/21   [provider]  QUEtiapine (SEROQUEL) 50 MG tablet Take 50 mg by mouth at bedtime. 10/19/21   [provider]  simvastatin (ZOCOR) 10 MG tablet Take  10 mg by mouth daily.    [provider]  simvastatin (ZOCOR) 40 MG tablet Take 40 mg by mouth daily. 10/19/21   [provider]  SUMAtriptan (IMITREX) 100 MG tablet Take 1 tablet (100 mg total) by mouth once as needed for up to 1 dose for migraine. May repeat in 2 hours if headache persists or recurs. 10/28/17   Levert Feinstein, MD  topiramate (TOPAMAX) 50 MG tablet Take 50 mg by mouth at bedtime. 09/24/21   [provider]  traZODone (DESYREL) 50 MG tablet Take 50 mg by mouth at bedtime.    [provider]  venlafaxine XR (EFFEXOR-XR) 150 MG 24 hr capsule Take 150 mg by mouth daily with breakfast.    [provider]  venlafaxine XR (EFFEXOR-XR) 150 MG 24 hr capsule Take 150 mg  by mouth daily. 09/24/21   [provider]      Allergies    Bee venom, Latex, Prednisone, Topamax [topiramate], and Prednisone    Review of Systems   Review of Systems  All other systems reviewed and are negative.   Physical Exam Updated Vital Signs BP (!) 178/122 (BP Location: Right Arm)   Pulse (!) 111   Temp 98.2 F (36.8 C) (Oral)   Resp 18   Ht 6' (1.829 m)   Wt 102.1 kg   SpO2 95%   BMI 30.52 kg/m  Physical Exam Vitals and nursing note reviewed.  Constitutional:      General: He is not in acute distress.    Appearance: Normal appearance.  HENT:     Head: Normocephalic and atraumatic.     Mouth/Throat:      Comments: There is mild swelling of the oral floor underneath the tongue. Eyes:     General:        Right eye: No discharge.        Left eye: No discharge.  Cardiovascular:     Rate and Rhythm: Regular rhythm. Tachycardia present.     Pulses: No decreased pulses.     Heart sounds: Normal heart sounds, S1 normal and S2 normal.  Pulmonary:     Comments: Clear to auscultation bilaterally.  Normal effort.  No respiratory distress.  No evidence of wheezes, rales, or rhonchi heard throughout. Abdominal:     General: Abdomen is flat. Bowel sounds are normal. There is no distension.     Tenderness: There is no abdominal tenderness. There is no guarding or rebound.  Musculoskeletal:        General: Normal range of motion.     Cervical back: Neck supple.  Skin:    General: Skin is warm and dry.     Findings: No rash.  Neurological:     General: No focal deficit present.     Mental Status: He is alert.  Psychiatric:        Mood and Affect: Mood normal.        Behavior: Behavior normal.     ED Results / Procedures / Treatments   Labs (all labs ordered are listed, but only abnormal results are displayed) Labs Reviewed  CBC WITH DIFFERENTIAL/PLATELET - Abnormal; Notable for the following components:      Result Value   WBC 17.2 (*)    RBC 5.83  (*)    Hemoglobin 17.2 (*)    Neutro Abs 11.6 (*)    Monocytes Absolute 2.0 (*)    Abs Immature Granulocytes 0.12 (*)    All other components within normal limits  BASIC METABOLIC PANEL - Abnormal; Notable for the following components:   Sodium 134 (*)    Potassium 3.3 (*)    Chloride 96 (*)    BUN 5 (*)    All other components within normal limits    EKG None  Radiology CT Maxillofacial W Contrast  Result Date: 11/23/2021 CLINICAL DATA:  Left lower dental pain, concern for infection EXAM: CT MAXILLOFACIAL WITH CONTRAST TECHNIQUE: Multidetector CT imaging of the maxillofacial structures was performed with intravenous contrast. Multiplanar CT image reconstructions were also generated. RADIATION DOSE REDUCTION: This exam was performed according to the departmental dose-optimization program which includes automated exposure control, adjustment of the mA and/or kV according to patient size and/or use of iterative reconstruction technique. CONTRAST:  22mL OMNIPAQUE IOHEXOL 300 MG/ML  SOLN COMPARISON:  None Available. FINDINGS: Osseous: No fracture or mandibular dislocation. No destructive process. Poor dentition, with multifocal dental caries. Some periapical lucency is noted about the roots what was likely the right first maxillary premolar (series 7, image 43) with minimal cortical dehiscence but no adjacent collection. No evidence of odontogenic abscess or cortical dehiscence on the left or associated with the mandible. Orbits: Negative. No traumatic or inflammatory finding. Sinuses: Mucous retention cysts in the left-greater-than-right maxillary sinus. Fluid in right mastoid air cells. Soft tissues: No low-density collection or peripherally enhancing collection to suggest abscess or phlegmon. Limited intracranial: No significant or unexpected finding. IMPRESSION: No evidence of periapical lucency or odontogenic abscess on the left or associated with the mandible. Some periapical lucency is  associated with the roots of the is likely the first right maxillary premolar. Multifocal dental caries. Electronically Signed   By: Wiliam Ke M.D.   On: 11/23/2021 20:33    Procedures Procedures    Medications Ordered in ED Medications  Ampicillin-Sulbactam (UNASYN) 3 g in sodium chloride 0.9 % 100 mL IVPB (3 g Intravenous New Bag/Given 11/23/21 1920)  morphine (PF) 4 MG/ML injection 4 mg (has no administration in time range)  fentaNYL (SUBLIMAZE) injection 50 mcg (50 mcg Intravenous Given 11/23/21 1916)  lidocaine (XYLOCAINE) 2 % viscous mouth solution 15 mL (15 mLs Mouth/Throat Given 11/23/21 1915)  iohexol (OMNIPAQUE) 300 MG/ML solution 75 mL (75 mLs Intravenous Contrast Given 11/23/21 2011)    ED Course/ Medical Decision Making/ A&P Clinical Course as of 11/23/21 2046  Sat Nov 23, 2021  2044 CBC with Differential(!) There is evidence of leukocytosis.  There is also elevated hemoglobin in the setting of tobacco use. [CF]  2045 Basic metabolic panel(!) Mild hyponatremia and mild hypokalemia and mild hypochloremia. [CF]  2045 CT Maxillofacial W Contrast No evidence of acute deep-seated infection.  I personally ordered and interpreted this study.  I do agree with the radiologist interpretation. [CF]    Clinical Course User Index [CF] Teressa Lower, PA-C                           Medical Decision Making Gary Higgins is a 41 y.o. male patient who presents to the emergency department today for further evaluation of possible dental abscess.  I am concerned for a deep-seated infection given the gingival swelling and swelling underneath the tongue.  He is having slight difficult time with his words secondary to the swelling.  I will get basic labs, CT scan of the face and give him some pain medication.  He is tachycardic and hypertension which could be from pain.   Amount and/or Complexity of Data  Reviewed Labs: ordered. Decision-making details documented in ED Course. Radiology:  ordered. Decision-making details documented in ED Course.  Risk Prescription drug management.   Final Clinical Impression(s) / ED Diagnoses Final diagnoses:  Pain, dental    Rx / DC Orders ED Discharge Orders          Ordered    ibuprofen (ADVIL) 600 MG tablet  Every 6 hours PRN        11/23/21 2043    lidocaine (XYLOCAINE) 2 % solution  As needed        11/23/21 2043              Teressa Lower, [place] 11/23/21 2046    Bethann Berkshire, MD 11/25/21 1217

## 2021-11-28 DIAGNOSIS — E039 Hypothyroidism, unspecified: Secondary | ICD-10-CM | POA: Diagnosis not present

## 2021-11-28 DIAGNOSIS — Z79899 Other long term (current) drug therapy: Secondary | ICD-10-CM | POA: Diagnosis not present

## 2022-02-13 DIAGNOSIS — R69 Illness, unspecified: Secondary | ICD-10-CM | POA: Diagnosis not present

## 2022-02-13 DIAGNOSIS — E7849 Other hyperlipidemia: Secondary | ICD-10-CM | POA: Diagnosis not present

## 2022-02-13 DIAGNOSIS — R739 Hyperglycemia, unspecified: Secondary | ICD-10-CM | POA: Diagnosis not present

## 2022-02-13 DIAGNOSIS — I10 Essential (primary) hypertension: Secondary | ICD-10-CM | POA: Diagnosis not present

## 2022-02-13 DIAGNOSIS — Z6833 Body mass index (BMI) 33.0-33.9, adult: Secondary | ICD-10-CM | POA: Diagnosis not present

## 2022-04-15 DIAGNOSIS — Z20828 Contact with and (suspected) exposure to other viral communicable diseases: Secondary | ICD-10-CM | POA: Diagnosis not present

## 2022-04-15 DIAGNOSIS — Z6832 Body mass index (BMI) 32.0-32.9, adult: Secondary | ICD-10-CM | POA: Diagnosis not present

## 2022-04-15 DIAGNOSIS — R030 Elevated blood-pressure reading, without diagnosis of hypertension: Secondary | ICD-10-CM | POA: Diagnosis not present

## 2022-04-15 DIAGNOSIS — J069 Acute upper respiratory infection, unspecified: Secondary | ICD-10-CM | POA: Diagnosis not present

## 2022-04-15 DIAGNOSIS — R69 Illness, unspecified: Secondary | ICD-10-CM | POA: Diagnosis not present

## 2022-04-15 DIAGNOSIS — H6691 Otitis media, unspecified, right ear: Secondary | ICD-10-CM | POA: Diagnosis not present

## 2022-04-21 DIAGNOSIS — R69 Illness, unspecified: Secondary | ICD-10-CM | POA: Diagnosis not present

## 2022-04-21 DIAGNOSIS — Z20828 Contact with and (suspected) exposure to other viral communicable diseases: Secondary | ICD-10-CM | POA: Diagnosis not present

## 2022-05-02 ENCOUNTER — Emergency Department (HOSPITAL_COMMUNITY): Payer: 59

## 2022-05-02 ENCOUNTER — Other Ambulatory Visit: Payer: Self-pay

## 2022-05-02 ENCOUNTER — Emergency Department (HOSPITAL_COMMUNITY)
Admission: EM | Admit: 2022-05-02 | Discharge: 2022-05-02 | Disposition: A | Discharge status: Home / Self Care | Payer: 59 | Attending: Student | Admitting: Student

## 2022-05-02 ENCOUNTER — Encounter (HOSPITAL_COMMUNITY): Payer: Self-pay | Admitting: Emergency Medicine

## 2022-05-02 DIAGNOSIS — W182XXA Fall in (into) shower or empty bathtub, initial encounter: Secondary | ICD-10-CM | POA: Diagnosis not present

## 2022-05-02 DIAGNOSIS — S51012A Laceration without foreign body of left elbow, initial encounter: Secondary | ICD-10-CM | POA: Diagnosis not present

## 2022-05-02 DIAGNOSIS — S5002XA Contusion of left elbow, initial encounter: Secondary | ICD-10-CM | POA: Diagnosis not present

## 2022-05-02 DIAGNOSIS — Z91040 Latex allergy status: Secondary | ICD-10-CM | POA: Insufficient documentation

## 2022-05-02 DIAGNOSIS — S51002A Unspecified open wound of left elbow, initial encounter: Secondary | ICD-10-CM | POA: Diagnosis not present

## 2022-05-02 DIAGNOSIS — S59902A Unspecified injury of left elbow, initial encounter: Secondary | ICD-10-CM | POA: Diagnosis not present

## 2022-05-02 MED ORDER — NAPROXEN 250 MG PO TABS
500.0000 mg | ORAL_TABLET | Freq: Once | ORAL | Status: AC
  Administered 2022-05-02: 500 mg via ORAL
  Filled 2022-05-02: qty 2

## 2022-05-02 MED ORDER — NAPROXEN 500 MG PO TABS: 500.0000 mg | ORAL_TABLET | Freq: Two times a day (BID) | ORAL | 0 refills | Status: DC

## 2022-05-02 MED ORDER — BACITRACIN ZINC 500 UNIT/GM EX OINT
TOPICAL_OINTMENT | Freq: Two times a day (BID) | CUTANEOUS | Status: DC
  Administered 2022-05-02: 1 via TOPICAL
  Filled 2022-05-02: qty 1.8

## 2022-05-02 NOTE — Discharge Instructions (Addendum)
You have some swelling to your left elbow but there is no sign of fracture on your x-ray.  Use the naproxen for the pain and follow-up with your primary care doctor or orthopedics.  The little bit of numbness on your hand can be from the swelling and should go away as the swelling improves.  He can use ice for 15 minutes at a time several times a day to help with this as well and use a sling for comfort.  Come back to the ER for any new or worsening symptoms.

## 2022-05-02 NOTE — ED Provider Notes (Signed)
[facility] Provider Note   CSN: [id number] Arrival date & time: 05/02/22  [id number]     History  Chief Complaint  Patient presents with   Gary Higgins is a 42 y.o. male.  He presents the ED complaining of left elbow injury that started yesterday.  He states he was in the shower last night and slipped when he turned around and fell directly on the left elbow.  He states he thinks he may have hit his head on the shower wall but denies loss of consciousness headache or tenderness to his scalp.  No neck pain or back pain.  He tried ibuprofen without relief.  He does have a small laceration to the elbow and states it has been bleeding since last night.  He is not on blood thinners.  HPI     Home Medications Prior to Admission medications   Medication Sig Start Date End Date Taking? Authorizing Provider  naproxen (NAPROSYN) 500 MG tablet Take 1 tablet (500 mg total) by mouth 2 (two) times daily. 05/02/22  Yes Cedarius Kersh A, PA-C  ALPRAZolam (XANAX) 0.5 MG tablet Take 0.5 mg by mouth 3 (three) times daily as needed.     [provider]  ALPRAZolam Duanne Moron) 0.5 MG tablet Take 0.5 mg by mouth 3 (three) times daily as needed. 10/06/21   [provider]  ARIPiprazole (ABILIFY) 2 MG tablet Take 2 mg by mouth at bedtime. 09/24/21   [provider]  atomoxetine (STRATTERA) 80 MG capsule Take 80 mg by mouth daily.    [provider]  doxycycline (VIBRA-TABS) 100 MG tablet Take 100 mg by mouth 2 (two) times daily. 10/22/21   [provider]  doxycycline (VIBRAMYCIN) 100 MG capsule Take 1 capsule (100 mg total) by mouth 2 (two) times daily. 12/04/20   Fransico Meadow, PA-C  Fremanezumab-vfrm (AJOVY) 225 MG/1.5ML SOSY Inject 225 mg into the skin every 30 (thirty) days. 10/28/17   Marcial Pacas, MD  ibuprofen (ADVIL) 600 MG tablet Take 1 tablet (600 mg total) by mouth every 6 (six) hours as needed. 11/23/21    Myna Bright M, PA-C  lidocaine (XYLOCAINE) 2 % solution Use as directed 15 mLs in the mouth or throat as needed for mouth pain. 11/23/21   Hendricks Limes, PA-C  lithium carbonate 150 MG capsule Take 150 mg by mouth daily.    [provider]  lithium carbonate 150 MG capsule Take 150 mg by mouth 2 (two) times daily. 10/19/21   [provider]  ondansetron (ZOFRAN ODT) 4 MG disintegrating tablet Take 1 tablet (4 mg total) by mouth every 8 (eight) hours as needed. 10/28/17   Marcial Pacas, MD  penicillin v potassium (VEETID) 500 MG tablet Take 1 tablet (500 mg total) by mouth 4 (four) times daily. 02/03/18   Ripley Fraise, MD  propranolol ER (INDERAL LA) 120 MG 24 hr capsule Take 120 mg by mouth daily.    [provider]  propranolol ER (INDERAL LA) 120 MG 24 hr capsule Take 120 mg by mouth daily. 09/24/21   [provider]  QUEtiapine (SEROQUEL) 50 MG tablet Take 50 mg by mouth at bedtime. 10/19/21   [provider]  simvastatin (ZOCOR) 10 MG tablet Take 10 mg by mouth daily.    [provider]  simvastatin (ZOCOR) 40 MG tablet Take 40 mg by mouth daily. 10/19/21   [provider]  SUMAtriptan (IMITREX) 100  MG tablet Take 1 tablet (100 mg total) by mouth once as needed for up to 1 dose for migraine. May repeat in 2 hours if headache persists or recurs. 10/28/17   Marcial Pacas, MD  topiramate (TOPAMAX) 50 MG tablet Take 50 mg by mouth at bedtime. 09/24/21   [provider]  traZODone (DESYREL) 50 MG tablet Take 50 mg by mouth at bedtime.    [provider]  venlafaxine XR (EFFEXOR-XR) 150 MG 24 hr capsule Take 150 mg by mouth daily with breakfast.    [provider]  venlafaxine XR (EFFEXOR-XR) 150 MG 24 hr capsule Take 150 mg by mouth daily. 09/24/21   [provider]      Allergies    Bee venom, Latex, Prednisone, Topamax [topiramate], and Prednisone    Review of Systems   Review of Systems  Physical  Exam Updated Vital Signs BP (!) 146/84 (BP Location: Right Arm)   Pulse 67   Temp 97.7 F (36.5 C) (Oral)   Resp 19   Ht 6' (1.829 m)   Wt 99.8 kg   SpO2 100%   BMI 29.84 kg/m  Physical Exam Vitals and nursing note reviewed.  Constitutional:      General: He is not in acute distress.    Appearance: He is well-developed.  HENT:     Head: Normocephalic and atraumatic.  Eyes:     Conjunctiva/sclera: Conjunctivae normal.  Cardiovascular:     Rate and Rhythm: Normal rate and regular rhythm.     Heart sounds: No murmur heard. Pulmonary:     Effort: Pulmonary effort is normal. No respiratory distress.     Breath sounds: Normal breath sounds.  Abdominal:     Palpations: Abdomen is soft.     Tenderness: There is no abdominal tenderness.  Musculoskeletal:        General: No swelling.     Right elbow: No tenderness.     Left elbow: Tenderness present in olecranon process.     Cervical back: Normal and neck supple.     Thoracic back: Normal.     Lumbar back: Normal.     Comments: Radial pulse 2+ left wrist  Skin:    General: Skin is warm and dry.     Capillary Refill: Capillary refill takes less than 2 seconds.  Neurological:     General: No focal deficit present.     Mental Status: He is alert and oriented to person, place, and time.  Psychiatric:        Mood and Affect: Mood normal.     ED Results / Procedures / Treatments   Labs (all labs ordered are listed, but only abnormal results are displayed) Labs Reviewed - No data to display  EKG None  Radiology DG Elbow Complete Left  Result Date: 05/02/2022 CLINICAL DATA:  Status post fall in the shower this morning EXAM: LEFT ELBOW - COMPLETE 3+ VIEW COMPARISON:  None Available. FINDINGS: No acute fracture or dislocation. No aggressive osseous lesion. Normal alignment. Soft tissue wound along the posterior aspect of the elbow. No radiopaque foreign body or soft tissue emphysema. IMPRESSION: No acute osseous injury of the  left elbow. Electronically Signed   By: Kathreen Devoid M.D.   On: 05/02/2022 10:28    Procedures Procedures    Medications Ordered in ED Medications  naproxen (NAPROSYN) tablet 500 mg (has no administration in time range)  bacitracin ointment (has no administration in time range)    ED Course/ Medical Decision  Making/ A&P                             Medical Decision Making This patient presents to the ED for concern of left elbow injury last night at 11 pm, this involves an extensive number of treatment options, and is a complaint that carries with it a high risk of complications and morbidity.  The differential diagnosis includes fracture, contusion, sprain, dislocation,   Additional history obtained:  Additional history obtained from EMR External records from outside source obtained and reviewed including prior visits outpatient and ED visits    Imaging Studies ordered:  I ordered imaging studies including Xray left elbow  I independently visualized and interpreted imaging which showed no fracture or dislocation I agree with the radiologist interpretation    Problem List / ED Course / Critical interventions / Medication management  Injury-patient has a contusion, and a small overlying very superficial laceration that does not need repair and will be not indicated for repair given nearly 12 hours left since the laceration occurred.  Bleeding is controlled.  Wound was cleaned, applied bacitracin gauze and Ace wrap send patient put in sling for comfort advised on orthopedic follow-up and return precautions Of note he did have some decree sensation to light touch in the left fourth and fifth digits but has sensation to pressure.  Discussed with him this is likely some neuropraxia from the swelling from his injury and should abate but if it is not getting better over the next couple days he needs to follow-up closely with orthopedics as discussed. I ordered medication including  naproxen and for paine  Reevaluation of the patient after these medicines showed that the patient improved I have reviewed the patients home medicines and have made adjustments as needed        Amount and/or Complexity of Data Reviewed Radiology: ordered.  Risk Prescription drug management.           Final Clinical Impression(s) / ED Diagnoses Final diagnoses:  Contusion of left elbow, initial encounter  Laceration of left elbow, initial encounter    Rx / DC Orders ED Discharge Orders          Ordered    naproxen (NAPROSYN) 500 MG tablet  2 times daily        05/02/22 [address] PA-C 05/02/22 1100    Kommor, [place], MD 05/02/22 1903

## 2022-05-02 NOTE — ED Triage Notes (Signed)
Pt reports he slipped and fell in the shower last night. Pt reports he hit the back of his head, back and left elbow. Denies any nausea, vomiting, dizziness, headache or change in vision. Pt is unable to fully extend left elbow. Pt took 800 oral IBP last night around 2300.

## 2022-05-05 DIAGNOSIS — Z6833 Body mass index (BMI) 33.0-33.9, adult: Secondary | ICD-10-CM | POA: Diagnosis not present

## 2022-05-05 DIAGNOSIS — R030 Elevated blood-pressure reading, without diagnosis of hypertension: Secondary | ICD-10-CM | POA: Diagnosis not present

## 2022-05-05 DIAGNOSIS — S5002XA Contusion of left elbow, initial encounter: Secondary | ICD-10-CM | POA: Diagnosis not present

## 2022-05-05 DIAGNOSIS — L03114 Cellulitis of left upper limb: Secondary | ICD-10-CM | POA: Diagnosis not present

## 2022-05-05 DIAGNOSIS — F17210 Nicotine dependence, cigarettes, uncomplicated: Secondary | ICD-10-CM | POA: Diagnosis not present

## 2022-05-16 DIAGNOSIS — I10 Essential (primary) hypertension: Secondary | ICD-10-CM | POA: Diagnosis not present

## 2022-05-16 DIAGNOSIS — Z6833 Body mass index (BMI) 33.0-33.9, adult: Secondary | ICD-10-CM | POA: Diagnosis not present

## 2022-05-16 DIAGNOSIS — E782 Mixed hyperlipidemia: Secondary | ICD-10-CM | POA: Diagnosis not present

## 2022-05-16 DIAGNOSIS — F3160 Bipolar disorder, current episode mixed, unspecified: Secondary | ICD-10-CM | POA: Diagnosis not present

## 2022-05-16 DIAGNOSIS — F411 Generalized anxiety disorder: Secondary | ICD-10-CM | POA: Diagnosis not present

## 2022-05-16 DIAGNOSIS — R739 Hyperglycemia, unspecified: Secondary | ICD-10-CM | POA: Diagnosis not present

## 2022-05-19 ENCOUNTER — Encounter: Payer: Self-pay | Admitting: Orthopedic Surgery

## 2022-05-19 ENCOUNTER — Ambulatory Visit (INDEPENDENT_AMBULATORY_CARE_PROVIDER_SITE_OTHER): Payer: 59

## 2022-05-19 ENCOUNTER — Ambulatory Visit (INDEPENDENT_AMBULATORY_CARE_PROVIDER_SITE_OTHER): Payer: Medicaid Other | Admitting: Orthopedic Surgery

## 2022-05-19 ENCOUNTER — Other Ambulatory Visit: Payer: Self-pay | Admitting: Orthopedic Surgery

## 2022-05-19 VITALS — BP 134/94 | HR 60 | Ht 72.0 in | Wt 230.0 lb

## 2022-05-19 DIAGNOSIS — M25522 Pain in left elbow: Secondary | ICD-10-CM

## 2022-05-19 DIAGNOSIS — L03114 Cellulitis of left upper limb: Secondary | ICD-10-CM

## 2022-05-19 MED ORDER — DOXYCYCLINE HYCLATE 100 MG PO TABS: 100.0000 mg | ORAL_TABLET | Freq: Two times a day (BID) | ORAL | 1 refills | Status: AC

## 2022-05-19 MED ORDER — TRAMADOL HCL 50 MG PO TABS: 50.0000 mg | ORAL_TABLET | Freq: Four times a day (QID) | ORAL | 0 refills | Status: AC | PRN

## 2022-05-19 NOTE — Progress Notes (Signed)
Nothing to add here
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
## 2022-05-19 NOTE — Patient Instructions (Signed)
Take tylenol 500 mg every 6 hrs   And tramadol 50 mg every 6 hrs

## 2022-05-27 ENCOUNTER — Ambulatory Visit: Payer: 59 | Admitting: Orthopedic Surgery

## 2023-03-02 IMAGING — CT CT L SPINE W/O CM
3 series · 13 of 33 positions shown, 16 images · non-contrast
Comparison: Report from lumbar MRI 10/15/2009. Thoracic radiography
01/13/2014

CLINICAL DATA: Mid to lower back pain unresponsive to medication

EXAM:
CT THORACIC AND LUMBAR SPINE WITHOUT CONTRAST
TECHNIQUE: Multidetector CT imaging of the thoracic and lumbar spine was
performed without contrast. Multiplanar CT image reconstructions
were also generated.

[Series 4: l spine soft · axial · 0.34mm/px · z∈[+961,+1127]mm · 5 of 121 slices shown, 7 images]
[im 19/121  soft-tissue]
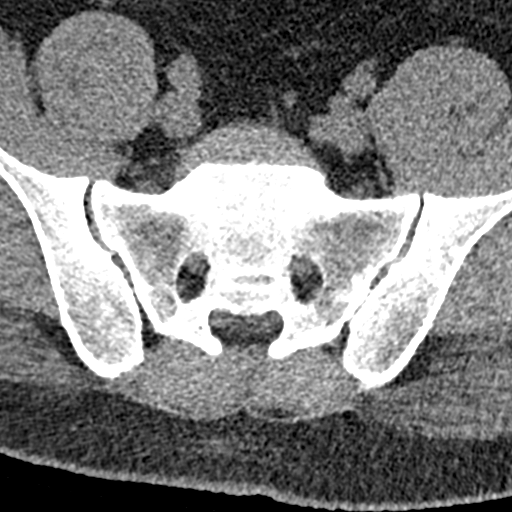
[im 19/121  bone]
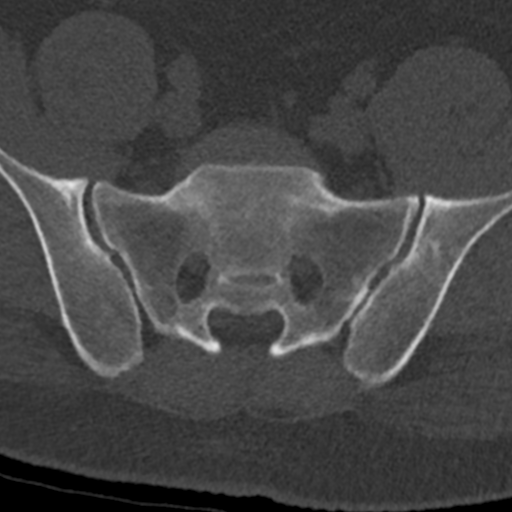
[im 37/121  bone]
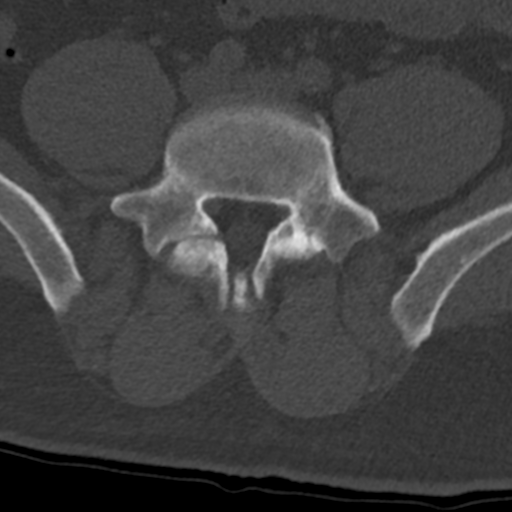
[im 65/121  bone]
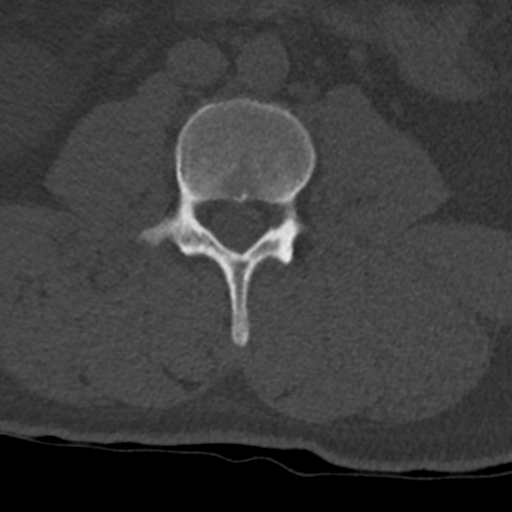
[im 84/121  bone]
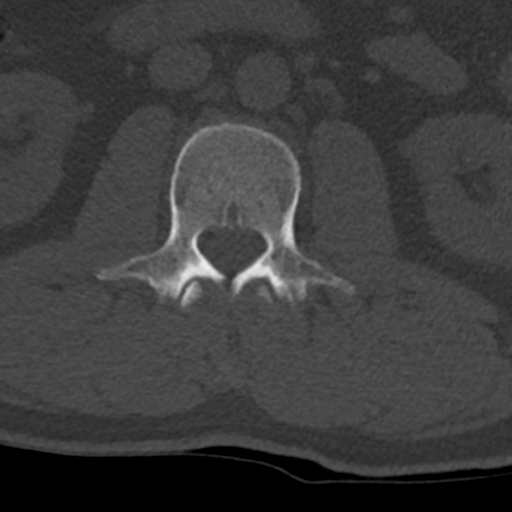
[im 102/121  soft-tissue]
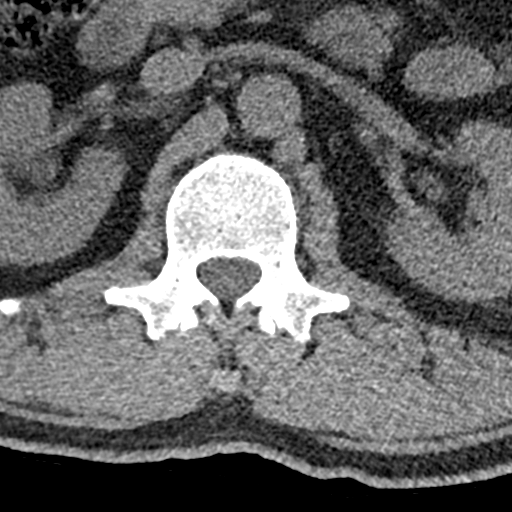
[im 102/121  bone]
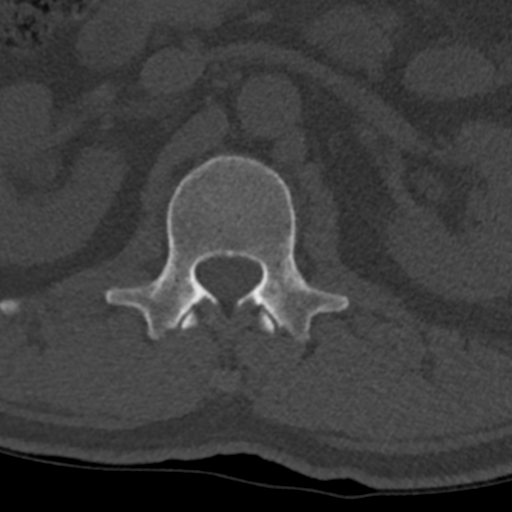

[Series 5: sagittal bone · sagittal · 0.35mm/px · 5 of 61 slices shown, 6 images]
[im 21/61  bone]
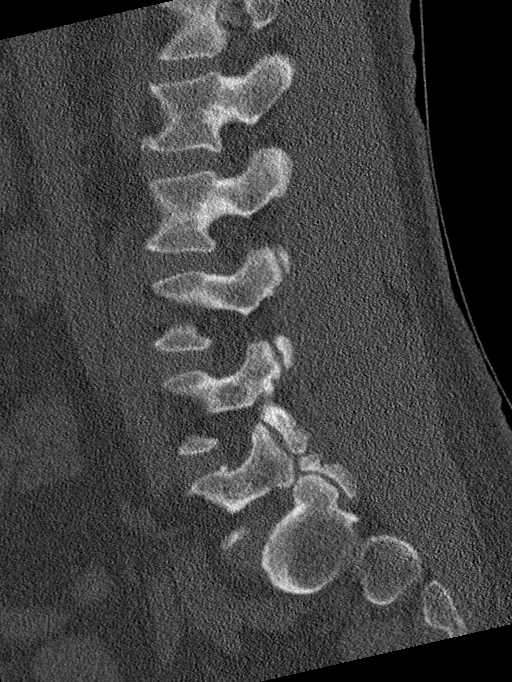
[im 26/61  bone]
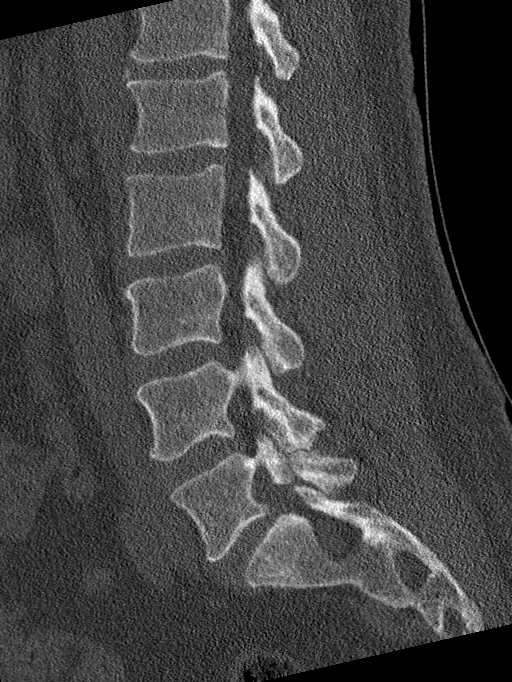
[im 31/61  soft-tissue]
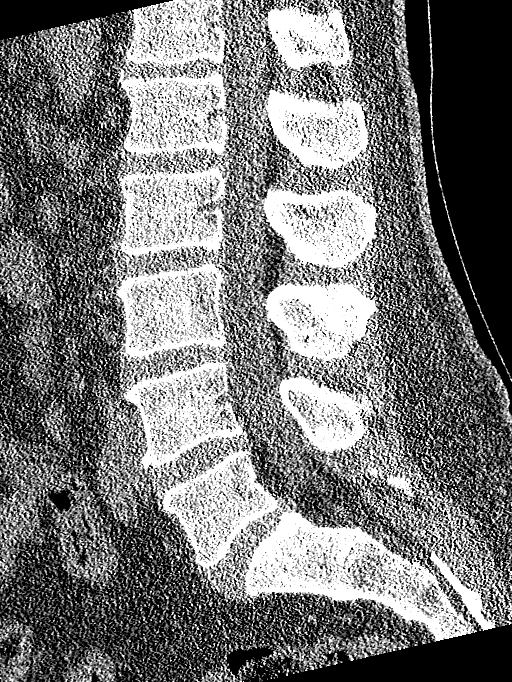
[im 31/61  bone]
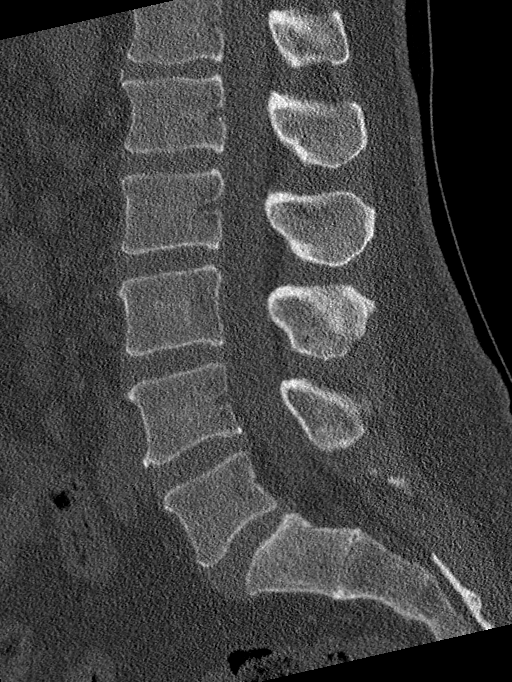
[im 36/61  bone]
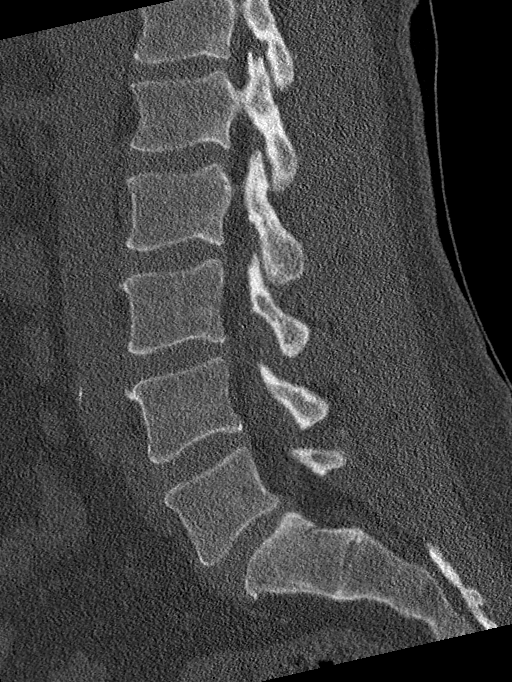
[im 41/61  bone]
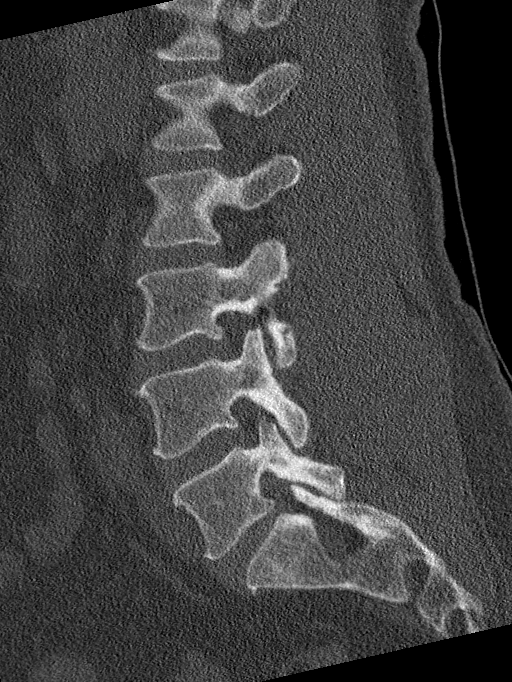

[Series 6: coronal bone · coronal · 0.35mm/px · 3 of 61 slices shown]
[im 13/61  bone]
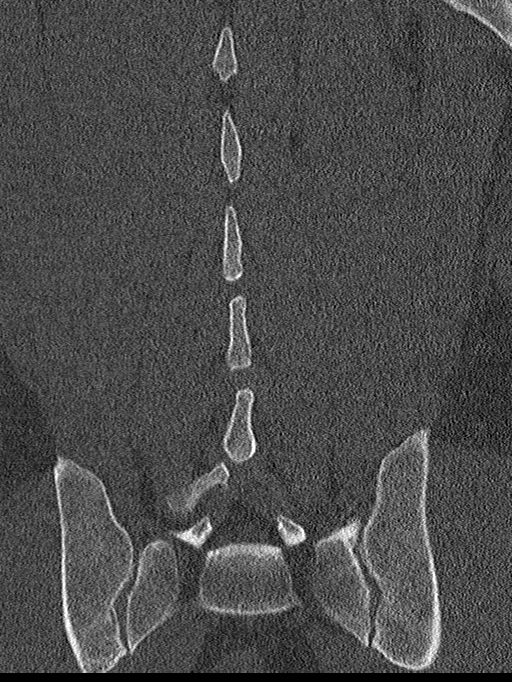
[im 25/61  bone]
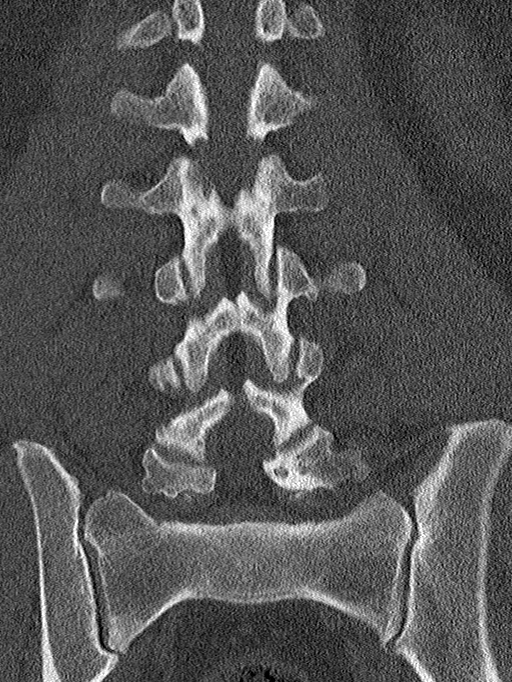
[im 37/61  bone]
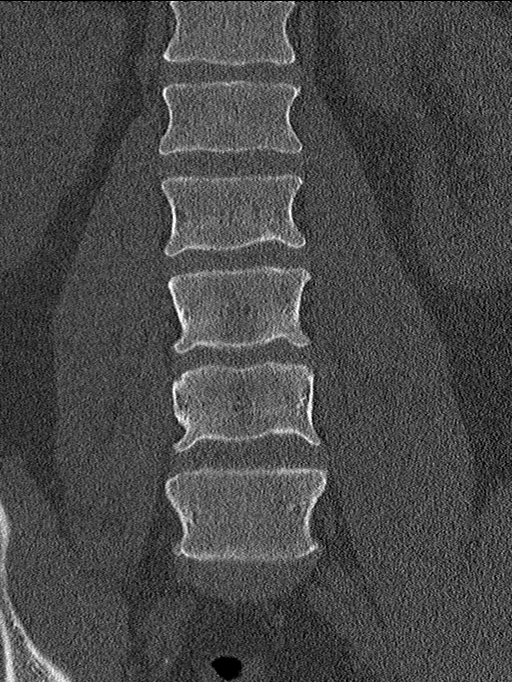

[13 of 33 positions shown; findings below may reference images not displayed]

FINDINGS: CT THORACIC SPINE FINDINGS

Alignment: Normal

Vertebrae: No acute fracture or focal pathologic process.

Paraspinal and other soft tissues: No visible paraspinal
inflammation or mass.

Disc levels: Partially covered C5-6 ACDF with solid arthrodesis.
Generalized mild thoracic disc space narrowing and ventral spurring.
Unremarkable facets. No visible neural impingement

CT LUMBAR SPINE FINDINGS

Segmentation: 5 lumbar type vertebrae

Alignment: Slight anterolisthesis at L5-S1.

Vertebrae: Chronic right L5 pars defect. No acute fracture,
aggressive bone lesion, or discitis

Paraspinal and other soft tissues: Atheromatous calcification the
aorta and iliacs.

Disc levels: Disc bulging at L5-S1 with up to moderate right
foraminal narrowing
IMPRESSION: CT THORACIC SPINE IMPRESSION

No acute or focal finding.  Mild spondylosis.

CT LUMBAR SPINE IMPRESSION

1. No acute finding.
2. L5 chronic right pars defect. L5-S1 disc bulging and mild
anterolisthesis with up to moderate right foraminal stenosis at this
level.

## 2023-04-08 ENCOUNTER — Emergency Department (HOSPITAL_COMMUNITY)
Admission: EM | Admit: 2023-04-08 | Discharge: 2023-04-08 | Disposition: A | Discharge status: Home / Self Care | Payer: Medicaid Other | Attending: Emergency Medicine | Admitting: Emergency Medicine

## 2023-04-08 ENCOUNTER — Emergency Department (HOSPITAL_COMMUNITY): Payer: Medicaid Other

## 2023-04-08 ENCOUNTER — Other Ambulatory Visit: Payer: Self-pay

## 2023-04-08 ENCOUNTER — Encounter (HOSPITAL_COMMUNITY): Payer: Self-pay

## 2023-04-08 DIAGNOSIS — R4589 Other symptoms and signs involving emotional state: Secondary | ICD-10-CM

## 2023-04-08 DIAGNOSIS — Z79899 Other long term (current) drug therapy: Secondary | ICD-10-CM | POA: Insufficient documentation

## 2023-04-08 DIAGNOSIS — Z91040 Latex allergy status: Secondary | ICD-10-CM | POA: Insufficient documentation

## 2023-04-08 DIAGNOSIS — R079 Chest pain, unspecified: Secondary | ICD-10-CM

## 2023-04-08 DIAGNOSIS — E876 Hypokalemia: Secondary | ICD-10-CM | POA: Insufficient documentation

## 2023-04-08 DIAGNOSIS — I10 Essential (primary) hypertension: Secondary | ICD-10-CM | POA: Diagnosis not present

## 2023-04-08 DIAGNOSIS — R0789 Other chest pain: Secondary | ICD-10-CM | POA: Diagnosis present

## 2023-04-08 DIAGNOSIS — F419 Anxiety disorder, unspecified: Secondary | ICD-10-CM | POA: Insufficient documentation

## 2023-04-08 LAB — CBC
HCT: 40.6 % (ref 39.0–52.0)
Hemoglobin: 13.7 g/dL (ref 13.0–17.0)
MCH: 29.7 pg (ref 26.0–34.0)
MCHC: 33.7 g/dL (ref 30.0–36.0)
MCV: 87.9 fL (ref 80.0–100.0)
Platelets: 176 10*3/uL (ref 150–400)
RBC: 4.62 MIL/uL (ref 4.22–5.81)
RDW: 12.6 % (ref 11.5–15.5)
WBC: 9.4 10*3/uL (ref 4.0–10.5)
nRBC: 0 % (ref 0.0–0.2)

## 2023-04-08 LAB — BASIC METABOLIC PANEL
Anion gap: 10 (ref 5–15)
BUN: 7 mg/dL (ref 6–20)
CO2: 26 mmol/L (ref 22–32)
Calcium: 9.2 mg/dL (ref 8.9–10.3)
Chloride: 106 mmol/L (ref 98–111)
Creatinine, Ser: 0.8 mg/dL (ref 0.61–1.24)
GFR, Estimated: >60 mL/min (ref >60)
Glucose, Bld: 108 mg/dL — ABNORMAL HIGH (ref 70–99)
Potassium: 3.1 mmol/L — ABNORMAL LOW (ref 3.5–5.1)
Sodium: 142 mmol/L (ref 135–145)

## 2023-04-08 LAB — TROPONIN I (HIGH SENSITIVITY): Troponin I (High Sensitivity): 6 ng/L (ref <18)

## 2023-04-08 MED ORDER — FAMOTIDINE 20 MG PO TABS
20.0000 mg | ORAL_TABLET | Freq: Once | ORAL | Status: AC
  Administered 2023-04-08: 20 mg via ORAL
  Filled 2023-04-08: qty 1

## 2023-04-08 MED ORDER — POTASSIUM CHLORIDE CRYS ER 20 MEQ PO TBCR: 20.0000 meq | EXTENDED_RELEASE_TABLET | Freq: Every day | ORAL | 0 refills | Status: AC

## 2023-04-08 MED ORDER — HYDROXYZINE HCL 25 MG PO TABS
25.0000 mg | ORAL_TABLET | Freq: Once | ORAL | Status: AC
  Administered 2023-04-08: 25 mg via ORAL
  Filled 2023-04-08: qty 1

## 2023-04-08 MED ORDER — HYDROXYZINE HCL 25 MG PO TABS: 25.0000 mg | ORAL_TABLET | Freq: Four times a day (QID) | ORAL | 0 refills | Status: AC | PRN

## 2023-04-08 MED ORDER — ALUM & MAG HYDROXIDE-SIMETH 200-200-20 MG/5ML PO SUSP
15.0000 mL | Freq: Once | ORAL | Status: AC
  Administered 2023-04-08: 15 mL via ORAL
  Filled 2023-04-08: qty 30
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
## 2023-04-08 NOTE — ED Triage Notes (Signed)
Pt with left sided chest pain reproducible with deep breathing.

## 2023-04-08 NOTE — Discharge Instructions (Signed)
As discussed, I do think that your symptoms are likely secondary to increased anxiousness.  Will send you home with the same medicine we gave you on the emergency department to take as needed.  Will also dyspnea with potassium as your potassium was slightly low.  Recommend follow-up with your primary care for reassessment.  Please not hesitate to return if the worrisome signs and symptoms we discussed become apparent.
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
